# Patient Record
Sex: Female | Born: 1985 | Race: Black or African American | Hispanic: No | Marital: Single | Smoking: Never smoker
Health system: Southern US, Community
[De-identification: ages and names within clinical notes are randomized; demographics above are authoritative.]

## PROBLEM LIST (undated history)

## (undated) DIAGNOSIS — F411 Generalized anxiety disorder: Secondary | ICD-10-CM

## (undated) DIAGNOSIS — N200 Calculus of kidney: Secondary | ICD-10-CM

## (undated) DIAGNOSIS — O210 Mild hyperemesis gravidarum: Secondary | ICD-10-CM

## (undated) HISTORY — PX: LITHOTRIPSY: SUR834

---

## 2013-01-12 ENCOUNTER — Ambulatory Visit: Payer: Self-pay

## 2013-02-06 ENCOUNTER — Ambulatory Visit (INDEPENDENT_AMBULATORY_CARE_PROVIDER_SITE_OTHER): Payer: BC Managed Care – PPO | Admitting: Internal Medicine

## 2013-02-06 ENCOUNTER — Ambulatory Visit: Payer: BC Managed Care – PPO

## 2013-02-06 VITALS — BP 106/75 | HR 70 | Temp 98.7°F | Resp 18 | Ht 62.0 in | Wt 171.0 lb

## 2013-02-06 DIAGNOSIS — Z6831 Body mass index (BMI) 31.0-31.9, adult: Secondary | ICD-10-CM

## 2013-02-06 DIAGNOSIS — M25562 Pain in left knee: Secondary | ICD-10-CM

## 2013-02-06 DIAGNOSIS — M25569 Pain in unspecified knee: Secondary | ICD-10-CM

## 2013-02-06 MED ORDER — MELOXICAM 15 MG PO TABS: 15.0000 mg | ORAL_TABLET | Freq: Every day | ORAL | Status: DC

## 2013-02-06 NOTE — Patient Instructions (Signed)

## 2013-02-06 NOTE — Progress Notes (Signed)
  Subjective:    Patient ID: Dawn Dixon, female    DOB: 09/25/1986, 27 y.o.   MRN: [id number]  HPI  Tuesday patient slipped on some ice in the parking lot of her residence. Her knee has been bothering her since. It hurts more on the outer side of the knee cap.   Review of Systems No prior arthritis or knee injury    Objective:   Physical Exam BP 106/75  Pulse 70  Temp(Src) 98.7 F (37.1 C) (Oral)  Resp 18  Ht 5\' 2"  (1.575 m)  Wt 171 lb (77.565 kg)  BMI 31.27 kg/m2  Left knee with ecchymoses on the medial aspect There is no effusion Patellar ballotts freely with mild tenderness Ligament stable to stressors Has full extension Flexion past 90 painful Antalgic gait     [facility] reading (PRIMARY) by  Dr.Newel Oien=NAD   Assessment & Plan:  Conclusion knee  Mobic 15 Heat and ice Adv as tol Pat sleeve if comf Followup 2 weeks if not well

## 2013-02-07 DIAGNOSIS — Z6831 Body mass index (BMI) 31.0-31.9, adult: Secondary | ICD-10-CM | POA: Insufficient documentation

## 2013-11-12 ENCOUNTER — Emergency Department: Payer: Self-pay | Admitting: Emergency Medicine

## 2019-01-25 ENCOUNTER — Ambulatory Visit
Admission: EM | Admit: 2019-01-25 | Discharge: 2019-01-25 | Disposition: A | Discharge status: Home / Self Care | Payer: Self-pay | Attending: Family Medicine | Admitting: Family Medicine

## 2019-01-25 ENCOUNTER — Other Ambulatory Visit: Payer: Self-pay

## 2019-01-25 DIAGNOSIS — O210 Mild hyperemesis gravidarum: Secondary | ICD-10-CM

## 2019-01-25 HISTORY — DX: Calculus of kidney: N20.0

## 2019-01-25 HISTORY — DX: Mild hyperemesis gravidarum: O21.0

## 2019-01-25 MED ORDER — ONDANSETRON 4 MG PO TBDP: 4.0000 mg | ORAL_TABLET | Freq: Three times a day (TID) | ORAL | 0 refills | Status: DC | PRN

## 2019-01-25 NOTE — Discharge Instructions (Signed)
Take medication as prescribed. Rest. Drink plenty of fluids.  Begin prenatal vitamins.  You need to be seen as soon as possible by OB/GYN.  Call tomorrow to schedule these appointments.  Follow up with your primary care physician this week as needed. Return to Urgent as needed.  Proceed directly to the emergency room for difficulty tolerating food or fluids, any vaginal or abdominal complaints, or worsening concerns.

## 2019-01-25 NOTE — ED Provider Notes (Signed)
[facility] ____________________________________________  Time seen: Approximately 2:00 PM  I have reviewed the triage vital signs and the nursing notes.   HISTORY  Chief Complaint Emesis During Pregnancy   HPI Dawn Dixon is a 33 y.o. female presenting for evaluation of nausea and vomiting during pregnancy.  Patient reports she has a history of hyperemesis gravida been diagnosed with the same during this pregnancy.  Reports that she is approximately [redacted] weeks pregnant.  Patient had confirming ultrasound at [facility] ER on 01/16/2019 showing a single intrauterine pregnancy.  Patient reports that she has a history of hyperemesis gravidarum and previous pregnancy that eventually led to a miscarriage.  Gravida 2 para 0.  States Zofran has been the only thing in the past that has worked for her hyperemesis.  Patient states that the ER did give her some, but she has ran out and she is requesting refill.  States vomiting is approximately 2-3 times a day, with intermittent nausea.  Denies any associated abdominal pain, vaginal discharge, vaginal bleeding, dysuria, back pain, fevers, chest pain, shortness of breath or other complaints.  States at this time feels well.  Patient states that she has been continued to tolerate foods and fluids but with continued nausea.  States that she is fearful to not have Zofran if she needs it.  Does not yet have an OB/GYN appointment scheduled.  Does not have an OB/GYN.   Past Medical History:  Diagnosis Date  . Hyperemesis gravidarum   . Kidney stones     Patient Active Problem List   Diagnosis Date Noted  . BMI 31.0-31.9,adult 02/07/2013    Past Surgical History:  Procedure Laterality Date  . LITHOTRIPSY       No current facility-administered medications for this encounter.   Current Outpatient Medications:  .  ondansetron (ZOFRAN ODT) 4 MG disintegrating tablet, Take 1 tablet (4 mg total) by mouth every 8 (eight) hours as needed for nausea  or vomiting., Disp: 10 tablet, Rfl: 0  Allergies Patient has no known allergies.  History reviewed. No pertinent family history.  Social History Social History   Tobacco Use  . Smoking status: Never Smoker  . Smokeless tobacco: Never Used  Substance Use Topics  . Alcohol use: Not Currently  . Drug use: Not Currently    Review of Systems Constitutional: No fever Cardiovascular: Denies chest pain. Respiratory: Denies shortness of breath. Gastrointestinal: No abdominal pain.  Positive nausea, intermittent vomiting.  No diarrhea.  No constipation.  Last bowel movement yesterday, normal. Genitourinary: Negative for dysuria. Musculoskeletal: Negative for back pain. Skin: Negative for rash.  ____________________________________________   PHYSICAL EXAM:  VITAL SIGNS: ED Triage Vitals  Enc Vitals Group     BP 01/25/19 1051 125/85     Pulse Rate 01/25/19 1051 61     Resp 01/25/19 1051 18     Temp 01/25/19 1051 98.9 F (37.2 C)     Temp Source 01/25/19 1051 Oral     SpO2 01/25/19 1051 100 %     Weight 01/25/19 1052 161 lb (73 kg)     Height 01/25/19 1052 5\' 1"  (1.549 m)     Head Circumference --      Peak Flow --      Pain Score 01/25/19 1051 0     Pain Loc --      Pain Edu? --      Excl. in GC? --     Constitutional: Alert and oriented. Well appearing and in no acute  distress. ENT      Head: Normocephalic and atraumatic. Cardiovascular: Normal rate, regular rhythm. Grossly normal heart sounds.  Good peripheral circulation. Respiratory: Normal respiratory effort without tachypnea nor retractions. Breath sounds are clear and equal bilaterally. No wheezes, rales, rhonchi. Gastrointestinal: Soft and nontender. Normal Bowel sounds. No CVA tenderness. Musculoskeletal: No midline cervical, thoracic or lumbar tenderness to palpation.  Neurologic:  Normal speech and language. Speech is normal. No gait instability.  Skin:  Skin is warm, dry and intact. No rash  noted. Psychiatric: Mood and affect are normal. Speech and behavior are normal. Patient exhibits appropriate insight and judgment   ___________________________________________   LABS (all labs ordered are listed, but only abnormal results are displayed)  Labs Reviewed - No data to display  PROCEDURES Procedures    INITIAL IMPRESSION / ASSESSMENT AND PLAN / ED COURSE  Pertinent labs & imaging results that were available during my care of the patient were reviewed by me and considered in my medical decision making (see chart for details).  Very well-appearing patient.  No acute distress.  Laughing and interactive during exam.  Confirm pregnancy via ultrasound from ED visit.  History of hyperemesis gravidarum.  States Zofran is only thing in the past that works well for her nausea and vomiting.  Discussed other medications including B vitamin containing, patient reports no success in the past but open to trying in the future.  Patient verbalized risk of possible pregnancy related reactions with Zofran and request prescription, Rx quantity 10 given.  Multiple names of OB/GYN's given and recommend call for same tomorrow to get first available appointment.  Discussed for any pain, vaginal discharge, vaginal bleeding, inability to tolerate food or fluids or worsening complaints proceed directly to emergency room.  Patient agreed to plan.  Also encouraged starting prenatal vitamins.  Discussed follow up with Primary care physician this week. Discussed follow up and return parameters including no resolution or any worsening concerns. Patient verbalized understanding and agreed to plan.   ____________________________________________   FINAL CLINICAL IMPRESSION(S) / ED DIAGNOSES  Final diagnoses:  Hyperemesis gravidarum     ED Discharge Orders         Ordered    ondansetron (ZOFRAN ODT) 4 MG disintegrating tablet  Every 8 hours PRN     01/25/19 1325           Note: This dictation was  prepared with Dragon dictation along with smaller phrase technology. Any transcriptional errors that result from this process are unintentional.         Renford DillsMiller, Reann Dobias, NP 01/25/19 1413

## 2019-01-25 NOTE — ED Triage Notes (Addendum)
Pt reports she ran out of her Ondansetron for her early pregnancy nausea/vomiting. Needs refill. States "I am 2-[redacted] weeks pregnant". Unsure of exact LMP

## 2019-12-18 NOTE — L&D Delivery Note (Signed)
Date of delivery: 07/18/2020 Estimated Date of Delivery: None noted. No LMP recorded (lmp unknown). EGA: Unknown  Delivery Note At 4:55 PM a viable female was delivered via Vaginal, Spontaneous (Presentation: OA, Right Occiput Anterior).   APGAR: 8, 9; weight: 3060 g, 6 pounds 12 ounces    Placenta status: Spontaneous, Intact.  Cord: 3 vessels with the following complications: marginal insertion.  Cord pH: NA  Called to see patient.  Mom pushed to deliver a viable female infant.  The head followed by shoulders, which delivered without difficulty, and the rest of the body.  No nuchal cord noted.  Baby to mom's chest.  Cord clamped and cut after 3 min delay.  No cord blood obtained.  Placenta delivered spontaneously, intact, with a 3-vessel cord. Initial brisk bleeding. Cytotec 800 mcg placed rectally All counts correct.  Hemostasis obtained with IV pitocin, cytotec and fundal massage.   Anesthesia: Epidural Episiotomy: None Lacerations: None Suture Repair: NA Est. Blood Loss (mL): 1,385  Mom to postpartum.  Baby to Couplet care / Skin to Skin.  Tresea Mall, CNM 07/18/2020, 6:08 PM

## 2020-07-17 ENCOUNTER — Inpatient Hospital Stay
Admission: EM | Admit: 2020-07-17 | Discharge: 2020-07-20 | DRG: 806 | Disposition: A | Discharge status: Home / Self Care | Payer: Medicaid Other | Attending: Advanced Practice Midwife | Admitting: Advanced Practice Midwife

## 2020-07-17 ENCOUNTER — Other Ambulatory Visit: Payer: Self-pay

## 2020-07-17 ENCOUNTER — Encounter: Payer: Self-pay | Admitting: Emergency Medicine

## 2020-07-17 DIAGNOSIS — F1190 Opioid use, unspecified, uncomplicated: Secondary | ICD-10-CM | POA: Diagnosis present

## 2020-07-17 DIAGNOSIS — O99324 Drug use complicating childbirth: Secondary | ICD-10-CM | POA: Diagnosis present

## 2020-07-17 DIAGNOSIS — Z20822 Contact with and (suspected) exposure to covid-19: Secondary | ICD-10-CM | POA: Diagnosis present

## 2020-07-17 DIAGNOSIS — Z3A00 Weeks of gestation of pregnancy not specified: Secondary | ICD-10-CM | POA: Diagnosis not present

## 2020-07-17 DIAGNOSIS — O0933 Supervision of pregnancy with insufficient antenatal care, third trimester: Secondary | ICD-10-CM

## 2020-07-17 DIAGNOSIS — O1204 Gestational edema, complicating childbirth: Secondary | ICD-10-CM | POA: Diagnosis present

## 2020-07-17 DIAGNOSIS — O43123 Velamentous insertion of umbilical cord, third trimester: Secondary | ICD-10-CM | POA: Diagnosis present

## 2020-07-17 DIAGNOSIS — D62 Acute posthemorrhagic anemia: Secondary | ICD-10-CM | POA: Diagnosis not present

## 2020-07-17 DIAGNOSIS — O9081 Anemia of the puerperium: Secondary | ICD-10-CM | POA: Diagnosis not present

## 2020-07-17 DIAGNOSIS — O99824 Streptococcus B carrier state complicating childbirth: Secondary | ICD-10-CM | POA: Diagnosis present

## 2020-07-17 DIAGNOSIS — F419 Anxiety disorder, unspecified: Secondary | ICD-10-CM | POA: Diagnosis present

## 2020-07-17 DIAGNOSIS — R609 Edema, unspecified: Secondary | ICD-10-CM

## 2020-07-17 DIAGNOSIS — D649 Anemia, unspecified: Secondary | ICD-10-CM | POA: Diagnosis present

## 2020-07-17 DIAGNOSIS — O99344 Other mental disorders complicating childbirth: Secondary | ICD-10-CM | POA: Diagnosis present

## 2020-07-17 DIAGNOSIS — R1030 Lower abdominal pain, unspecified: Secondary | ICD-10-CM | POA: Diagnosis present

## 2020-07-17 HISTORY — DX: Generalized anxiety disorder: F41.1

## 2020-07-17 LAB — CBC
HCT: 24.8 % — ABNORMAL LOW (ref 36.0–46.0)
Hemoglobin: 7.8 g/dL — ABNORMAL LOW (ref 12.0–15.0)
MCH: 24.1 pg — ABNORMAL LOW (ref 26.0–34.0)
MCHC: 31.5 g/dL (ref 30.0–36.0)
MCV: 76.5 fL — ABNORMAL LOW (ref 80.0–100.0)
Platelets: 244 10*3/uL (ref 150–400)
RBC: 3.24 MIL/uL — ABNORMAL LOW (ref 3.87–5.11)
RDW: 14.4 % (ref 11.5–15.5)
WBC: 11.3 10*3/uL — ABNORMAL HIGH (ref 4.0–10.5)
nRBC: 0 % (ref 0.0–0.2)

## 2020-07-17 LAB — URINE DRUG SCREEN, QUALITATIVE (ARMC ONLY)
Amphetamines, Ur Screen: NOT DETECTED
Barbiturates, Ur Screen: NOT DETECTED
Benzodiazepine, Ur Scrn: POSITIVE — AB
Cannabinoid 50 Ng, Ur ~~LOC~~: NOT DETECTED
Cocaine Metabolite,Ur ~~LOC~~: NOT DETECTED
MDMA (Ecstasy)Ur Screen: NOT DETECTED
Methadone Scn, Ur: NOT DETECTED
Opiate, Ur Screen: POSITIVE — AB
Phencyclidine (PCP) Ur S: NOT DETECTED
Tricyclic, Ur Screen: NOT DETECTED

## 2020-07-17 MED ORDER — PENICILLIN G POT IN DEXTROSE 60000 UNIT/ML IV SOLN
3.0000 10*6.[IU] | INTRAVENOUS | Status: DC
  Administered 2020-07-18 (×4): 3 10*6.[IU] via INTRAVENOUS
  Filled 2020-07-17 (×4): qty 50

## 2020-07-17 MED ORDER — LIDOCAINE HCL (PF) 1 % IJ SOLN: 30.0000 mL | INTRAMUSCULAR | Status: DC | PRN

## 2020-07-17 MED ORDER — OXYTOCIN BOLUS FROM INFUSION: 333.0000 mL | Freq: Once | INTRAVENOUS | Status: AC

## 2020-07-17 MED ORDER — BUTORPHANOL TARTRATE 1 MG/ML IJ SOLN: 1.0000 mg | INTRAMUSCULAR | Status: DC | PRN

## 2020-07-17 MED ORDER — OXYTOCIN 10 UNIT/ML IJ SOLN
INTRAMUSCULAR | Status: AC
  Filled 2020-07-17: qty 2

## 2020-07-17 MED ORDER — ONDANSETRON HCL 4 MG/2ML IJ SOLN: 4.0000 mg | Freq: Four times a day (QID) | INTRAMUSCULAR | Status: DC | PRN

## 2020-07-17 MED ORDER — MISOPROSTOL 200 MCG PO TABS
ORAL_TABLET | ORAL | Status: AC
  Administered 2020-07-18: 800 ug via RECTAL
  Filled 2020-07-17: qty 4

## 2020-07-17 MED ORDER — OXYTOCIN-SODIUM CHLORIDE 30-0.9 UT/500ML-% IV SOLN
INTRAVENOUS | Status: AC
  Administered 2020-07-18: 333 mL via INTRAVENOUS
  Filled 2020-07-17: qty 500

## 2020-07-17 MED ORDER — LACTATED RINGERS IV SOLN: 500.0000 mL | INTRAVENOUS | Status: DC | PRN

## 2020-07-17 MED ORDER — SODIUM CHLORIDE 0.9 % IV SOLN
5.0000 10*6.[IU] | Freq: Once | INTRAVENOUS | Status: AC
  Administered 2020-07-18: 5 10*6.[IU] via INTRAVENOUS
  Filled 2020-07-17: qty 5

## 2020-07-17 MED ORDER — OXYTOCIN-SODIUM CHLORIDE 30-0.9 UT/500ML-% IV SOLN
2.5000 [IU]/h | INTRAVENOUS | Status: DC
  Administered 2020-07-18 (×2): 2.5 [IU]/h via INTRAVENOUS
  Filled 2020-07-17: qty 500

## 2020-07-17 MED ORDER — LACTATED RINGERS IV SOLN: INTRAVENOUS | Status: DC

## 2020-07-17 MED ORDER — LIDOCAINE HCL (PF) 1 % IJ SOLN
INTRAMUSCULAR | Status: AC
  Filled 2020-07-17: qty 30

## 2020-07-17 MED ORDER — ACETAMINOPHEN 325 MG PO TABS: 650.0000 mg | ORAL_TABLET | ORAL | Status: DC | PRN

## 2020-07-17 MED ORDER — AMMONIA AROMATIC IN INHA
RESPIRATORY_TRACT | Status: AC
  Filled 2020-07-17: qty 10

## 2020-07-17 NOTE — H&P (Signed)
Obstetrics Admission History & Physical   Abdominal Pain (Pregnant No Prenatal Care) and Vaginal Bleeding   HPI:  34 y.o. [id number] AA F @ Unknown gest age, no known LMP. Admitted on 07/17/2020:   Patient Active Problem List   Diagnosis Date Noted  . No prenatal care in current pregnancy in third trimester 07/17/2020  . BMI 31.0-31.9,adult 02/07/2013     Presents for lower abdominal pain for the last 2 days, and only noted some spotting today.  She denies knowing she was pregnant, blames anxiety and blaming kidney potential disease (FH).  She has had edema the last 2 weeks.  Reports nausea and hyperemesis in prior two pregnancies, and had EAb with those.  No recent trauma or infection.  Prenatal care at: No prenatal care. Pregnancy complicated by that fact.  ROS: A review of systems was performed and negative, except as stated in the above HPI. PMHx:  Past Medical History:  Diagnosis Date  . Generalized anxiety disorder   . Hyperemesis gravidarum   . Kidney stones    PSHx:  Past Surgical History:  Procedure Laterality Date  . LITHOTRIPSY     Medications:  Medications Prior to Admission  Medication Sig Dispense Refill Last Dose  . ondansetron (ZOFRAN ODT) 4 MG disintegrating tablet Take 1 tablet (4 mg total) by mouth every 8 (eight) hours as needed for nausea or vomiting. 10 tablet 0 Unknown at Unknown time   Allergies: has No Known Allergies. OBHx:  OB History  Gravida Para Term Preterm AB Living  1            SAB TAB Ectopic Multiple Live Births               # Outcome Date GA Lbr Len/2nd Weight Sex Delivery Anes PTL Lv  1 Current            NAT:[id number]/[id number] except as detailed in HPI.Marland Kitchen  No family history of birth defects. Soc Hx: Alcohol: none and Recreational drug use: none  Objective:   Vitals:   07/17/20 2106 07/17/20 2227  BP: (!) 134/68 (!) 140/80  Pulse: 69 76  Resp: 18 18  Temp: 98.3 F (36.8 C)   SpO2: 99%    Constitutional: Well nourished,  well developed female in no acute distress.  HEENT: normal Skin: Warm and dry.  Cardiovascular:Regular rate and rhythm.   Extremity: trace to 1+ bilateral pedal edema Respiratory: Clear to auscultation bilateral. Normal respiratory effort Abdomen: gravid, ND, FHT present, mild tenderness on exam Back: no CVAT Neuro: DTRs 2+, Cranial nerves grossly intact Psych: Alert and Oriented x3. No memory deficits. Normal mood and affect.  MS: normal gait, normal bilateral lower extremity ROM/strength/stability.  Pelvic exam: is not limited by body habitus EGBUS: within normal limits Vagina: within normal limits and with normal mucosa Cervix: EXTERNAL GENITALIA: normal appearing vulva with no masses, tenderness or lesions CERVIX: 4 cm dilated, 80 effaced, -2 station, presenting part Vtx VAGINA: no abnormalities noted MEMBRANES: intact Uterus: Spontaneous uterine activity  Adnexa: not evaluated  EFM:FHR: 120 bpm, variability: moderate,  accelerations:  Present,  decelerations:  Absent Toco: Frequency: Every 10 minutes  [place] by self.    Vtx    BPD and HC limited as head in pelvis.  HC 36 3/7.  BPD 36 6/7.    AC 38 5/7    FL 35 0/7    Low Amniotic fluid, 3 cm deepest pocket  Assessment & Plan:   34 y.o. [id number] AA F @  Unknown gest age, Admitted on 07/17/2020:No prenatal care, seems close to full term by [place]   Active labor based on ctxs and 4 cm dilated  Admit for labor, Antibiotics for GBS prophylaxis, Observe for cervical change, Fetal Wellbeing Reassuring and AROM when Appropriate   Monitor fetal wellbeing.  Possibility for CS discussed if fetal status non-reassuring.  Risks due to no PNC and uncertain fetal and placental status.  Risks of prematurity discussed, as we only have a rough estimate of gestational age by ultrasound alone.  Patient has been counseled as to the risks of prematurity, including risks of respiratory depression or distress, jaundice, feeding or temperature regulation  problems, neurologic concerns including hearing or visual problems, and brain complications.  Patient understands the risks of tocolytic therapies along with their inherent failure rates, and the reasons for and against their use in individual circumstances.    Annamarie Major, MD, [facility], [facility] 07/17/2020  10:50 PM

## 2020-07-17 NOTE — ED Notes (Signed)
Per Dr. Derrill Kay, pt is greater than 20 weeks via bedside US, L & D called, taking pt to observation room 2.

## 2020-07-17 NOTE — OB Triage Note (Signed)
Pt arrived to ED for abdominal cramping that started about 2 days ago. She states she missed her depo shot one month, unsure of which month, and has been in denial about pregnancy. Patient reports she has not been seen anywhere for this pregnancy. Reports feeling good fetal movement. Denies leaking of fluid. Reports scant amount vaginal bleeding one time today. Reports feeling "cramping" approx once an hour. EFM applied and assessing.

## 2020-07-17 NOTE — OB Triage Note (Signed)
Report called from ED to notify of patient pregnant greater than 20 weeks by U/S with c/o lower abdominal and back pain. Patient arrived on unit and has remained in bathroom with water running. RN at bedside since approx 2154. Introduced self to patient through closed door. Pt states she's ok, she is just having some cramping and trying to get a urine sample. Visitor arrived to room and also checked on patient through closed door. Patient stated she is fine and would be out in a minute.

## 2020-07-17 NOTE — ED Notes (Addendum)
Pt has no OB-GYN in this area. Pt advised that resources are available to her if she does not want to keep the child.  Pt verbalizes understanding.

## 2020-07-17 NOTE — ED Triage Notes (Addendum)
PT arrived via POV with reports of low abdominal pain and back pain.  Pt is pregnant and unsure how far along, pt has not had any prenatal care, states she has been in denial about being pregnant.  Pt states she has had back pain x 2 months, pt reports legs and feet are swollen as well.  Pt states she has never been this pregnant before.

## 2020-07-18 ENCOUNTER — Inpatient Hospital Stay: Payer: Medicaid Other | Admitting: Anesthesiology

## 2020-07-18 ENCOUNTER — Encounter: Payer: Self-pay | Admitting: Obstetrics & Gynecology

## 2020-07-18 DIAGNOSIS — O0933 Supervision of pregnancy with insufficient antenatal care, third trimester: Secondary | ICD-10-CM

## 2020-07-18 LAB — RAPID HIV SCREEN (HIV 1/2 AB+AG)
HIV 1/2 Antibodies: NONREACTIVE
HIV-1 P24 Antigen - HIV24: NONREACTIVE

## 2020-07-18 LAB — CBC WITH DIFFERENTIAL/PLATELET
Abs Immature Granulocytes: 0.11 K/uL — ABNORMAL HIGH (ref 0.00–0.07)
Basophils Absolute: 0 K/uL (ref 0.0–0.1)
Basophils Relative: 0 %
Eosinophils Absolute: 0 K/uL (ref 0.0–0.5)
Eosinophils Relative: 0 %
HCT: 20.1 % — ABNORMAL LOW (ref 36.0–46.0)
Hemoglobin: 6.3 g/dL — ABNORMAL LOW (ref 12.0–15.0)
Immature Granulocytes: 1 %
Lymphocytes Relative: 9 %
Lymphs Abs: 1.7 K/uL (ref 0.7–4.0)
MCH: 24 pg — ABNORMAL LOW (ref 26.0–34.0)
MCHC: 31.3 g/dL (ref 30.0–36.0)
MCV: 76.4 fL — ABNORMAL LOW (ref 80.0–100.0)
Monocytes Absolute: 1.3 K/uL — ABNORMAL HIGH (ref 0.1–1.0)
Monocytes Relative: 7 %
Neutro Abs: 15 K/uL — ABNORMAL HIGH (ref 1.7–7.7)
Neutrophils Relative %: 83 %
Platelets: 254 K/uL (ref 150–400)
RBC: 2.63 MIL/uL — ABNORMAL LOW (ref 3.87–5.11)
RDW: 14.4 % (ref 11.5–15.5)
WBC: 18.1 K/uL — ABNORMAL HIGH (ref 4.0–10.5)
nRBC: 0 % (ref 0.0–0.2)

## 2020-07-18 LAB — COMPREHENSIVE METABOLIC PANEL
ALT: 14 U/L (ref 0–44)
AST: 22 U/L (ref 15–41)
Albumin: 2.8 g/dL — ABNORMAL LOW (ref 3.5–5.0)
Alkaline Phosphatase: 223 U/L — ABNORMAL HIGH (ref 38–126)
Anion gap: 9 (ref 5–15)
BUN: <5 mg/dL — ABNORMAL LOW (ref 6–20)
CO2: 20 mmol/L — ABNORMAL LOW (ref 22–32)
Calcium: 8.2 mg/dL — ABNORMAL LOW (ref 8.9–10.3)
Chloride: 107 mmol/L (ref 98–111)
Creatinine, Ser: 0.44 mg/dL (ref 0.44–1.00)
GFR calc Af Amer: >60 mL/min (ref >60)
GFR calc non Af Amer: >60 mL/min (ref >60)
Glucose, Bld: 87 mg/dL (ref 70–99)
Potassium: 3.7 mmol/L (ref 3.5–5.1)
Sodium: 136 mmol/L (ref 135–145)
Total Bilirubin: 0.9 mg/dL (ref 0.3–1.2)
Total Protein: 6.2 g/dL — ABNORMAL LOW (ref 6.5–8.1)

## 2020-07-18 LAB — PREPARE RBC (CROSSMATCH)

## 2020-07-18 LAB — ABO/RH: ABO/RH(D): B POS

## 2020-07-18 LAB — RPR: RPR Ser Ql: NONREACTIVE

## 2020-07-18 LAB — PROTEIN / CREATININE RATIO, URINE
Creatinine, Urine: 259 mg/dL
Protein Creatinine Ratio: 0.19 mg/mg{Cre} — ABNORMAL HIGH (ref 0.00–0.15)
Total Protein, Urine: 50 mg/dL

## 2020-07-18 LAB — CHLAMYDIA/NGC RT PCR (ARMC ONLY)
Chlamydia Tr: NOT DETECTED
N gonorrhoeae: NOT DETECTED

## 2020-07-18 LAB — GROUP B STREP BY PCR: Group B strep by PCR: POSITIVE — AB

## 2020-07-18 LAB — SARS CORONAVIRUS 2 BY RT PCR (HOSPITAL ORDER, PERFORMED IN ~~LOC~~ HOSPITAL LAB): SARS Coronavirus 2: NEGATIVE

## 2020-07-18 LAB — HEPATITIS B SURFACE ANTIGEN: Hepatitis B Surface Ag: NONREACTIVE

## 2020-07-18 MED ORDER — LACTATED RINGERS IV SOLN
500.0000 mL | Freq: Once | INTRAVENOUS | Status: AC
  Administered 2020-07-18: 500 mL via INTRAVENOUS

## 2020-07-18 MED ORDER — BUPIVACAINE HCL (PF) 0.25 % IJ SOLN
INTRAMUSCULAR | Status: DC | PRN
  Administered 2020-07-18: 2 mL via EPIDURAL

## 2020-07-18 MED ORDER — COCONUT OIL OIL: 1.0000 "application " | TOPICAL_OIL | Status: DC | PRN

## 2020-07-18 MED ORDER — LIDOCAINE-EPINEPHRINE (PF) 1.5 %-1:200000 IJ SOLN
INTRAMUSCULAR | Status: DC | PRN
  Administered 2020-07-18: 3 mL via EPIDURAL

## 2020-07-18 MED ORDER — MISOPROSTOL 200 MCG PO TABS: 800.0000 ug | ORAL_TABLET | Freq: Once | ORAL | Status: AC

## 2020-07-18 MED ORDER — EPHEDRINE 5 MG/ML INJ: 10.0000 mg | INTRAVENOUS | Status: DC | PRN

## 2020-07-18 MED ORDER — TERBUTALINE SULFATE 1 MG/ML IJ SOLN: 0.2500 mg | Freq: Once | INTRAMUSCULAR | Status: DC | PRN

## 2020-07-18 MED ORDER — LIDOCAINE HCL (PF) 1 % IJ SOLN
INTRAMUSCULAR | Status: DC | PRN
  Administered 2020-07-18: 3 mL via SUBCUTANEOUS

## 2020-07-18 MED ORDER — ONDANSETRON HCL 4 MG PO TABS
4.0000 mg | ORAL_TABLET | ORAL | Status: DC | PRN
  Filled 2020-07-18: qty 1

## 2020-07-18 MED ORDER — SODIUM CHLORIDE 0.9 % IV SOLN
INTRAVENOUS | Status: DC | PRN
  Administered 2020-07-18: 5 mL via EPIDURAL

## 2020-07-18 MED ORDER — PHENYLEPHRINE 40 MCG/ML (10ML) SYRINGE FOR IV PUSH (FOR BLOOD PRESSURE SUPPORT): 80.0000 ug | PREFILLED_SYRINGE | INTRAVENOUS | Status: DC | PRN

## 2020-07-18 MED ORDER — ACETAMINOPHEN 325 MG PO TABS
ORAL_TABLET | ORAL | Status: AC
  Filled 2020-07-18: qty 2

## 2020-07-18 MED ORDER — DIPHENHYDRAMINE HCL 50 MG/ML IJ SOLN: 12.5000 mg | INTRAMUSCULAR | Status: DC | PRN

## 2020-07-18 MED ORDER — DIPHENHYDRAMINE HCL 25 MG PO CAPS: 25.0000 mg | ORAL_CAPSULE | Freq: Four times a day (QID) | ORAL | Status: DC | PRN

## 2020-07-18 MED ORDER — AMMONIA AROMATIC IN INHA
RESPIRATORY_TRACT | Status: AC
  Filled 2020-07-18: qty 10

## 2020-07-18 MED ORDER — ONDANSETRON HCL 4 MG/2ML IJ SOLN: 4.0000 mg | INTRAMUSCULAR | Status: DC | PRN

## 2020-07-18 MED ORDER — DIBUCAINE (PERIANAL) 1 % EX OINT: 1.0000 "application " | TOPICAL_OINTMENT | CUTANEOUS | Status: DC | PRN

## 2020-07-18 MED ORDER — SENNOSIDES-DOCUSATE SODIUM 8.6-50 MG PO TABS
2.0000 | ORAL_TABLET | ORAL | Status: DC
  Administered 2020-07-20: 2 via ORAL
  Filled 2020-07-18 (×2): qty 2

## 2020-07-18 MED ORDER — WITCH HAZEL-GLYCERIN EX PADS: 1.0000 "application " | MEDICATED_PAD | CUTANEOUS | Status: DC | PRN

## 2020-07-18 MED ORDER — TETANUS-DIPHTH-ACELL PERTUSSIS 5-2.5-18.5 LF-MCG/0.5 IM SUSP
0.5000 mL | Freq: Once | INTRAMUSCULAR | Status: AC
  Administered 2020-07-20: 0.5 mL via INTRAMUSCULAR
  Filled 2020-07-18 (×2): qty 0.5

## 2020-07-18 MED ORDER — IBUPROFEN 600 MG PO TABS
ORAL_TABLET | ORAL | Status: AC
  Filled 2020-07-18: qty 1

## 2020-07-18 MED ORDER — MISOPROSTOL 200 MCG PO TABS: 200.0000 ug | ORAL_TABLET | Freq: Once | ORAL | Status: DC

## 2020-07-18 MED ORDER — FENTANYL 2.5 MCG/ML W/ROPIVACAINE 0.15% IN NS 100 ML EPIDURAL (ARMC)
12.0000 mL/h | EPIDURAL | Status: DC
  Administered 2020-07-18 (×2): 12 mL/h via EPIDURAL
  Filled 2020-07-18: qty 100

## 2020-07-18 MED ORDER — SIMETHICONE 80 MG PO CHEW: 80.0000 mg | CHEWABLE_TABLET | ORAL | Status: DC | PRN

## 2020-07-18 MED ORDER — BENZOCAINE-MENTHOL 20-0.5 % EX AERO: 1.0000 "application " | INHALATION_SPRAY | CUTANEOUS | Status: DC | PRN

## 2020-07-18 MED ORDER — OXYCODONE HCL 5 MG PO TABS
5.0000 mg | ORAL_TABLET | Freq: Four times a day (QID) | ORAL | Status: AC | PRN
  Administered 2020-07-18 – 2020-07-19 (×3): 5 mg via ORAL
  Filled 2020-07-18 (×3): qty 1

## 2020-07-18 MED ORDER — OXYTOCIN-SODIUM CHLORIDE 30-0.9 UT/500ML-% IV SOLN
1.0000 m[IU]/min | INTRAVENOUS | Status: DC
  Administered 2020-07-18: 2 m[IU]/min via INTRAVENOUS

## 2020-07-18 MED ORDER — PRENATAL MULTIVITAMIN CH
1.0000 | ORAL_TABLET | Freq: Every day | ORAL | Status: DC
  Administered 2020-07-19 – 2020-07-20 (×2): 1 via ORAL
  Filled 2020-07-18 (×2): qty 1

## 2020-07-18 MED ORDER — ALPRAZOLAM ER 0.5 MG PO TB24
0.5000 mg | ORAL_TABLET | Freq: Every day | ORAL | Status: DC
  Filled 2020-07-18: qty 1

## 2020-07-18 MED ORDER — SODIUM CHLORIDE 0.9% IV SOLUTION: Freq: Once | INTRAVENOUS | Status: DC

## 2020-07-18 MED ORDER — ACETAMINOPHEN 325 MG PO TABS
650.0000 mg | ORAL_TABLET | ORAL | Status: DC | PRN
  Administered 2020-07-18 – 2020-07-19 (×4): 650 mg via ORAL
  Filled 2020-07-18 (×3): qty 2

## 2020-07-18 MED ORDER — IBUPROFEN 600 MG PO TABS
600.0000 mg | ORAL_TABLET | Freq: Four times a day (QID) | ORAL | Status: DC
  Administered 2020-07-18 – 2020-07-20 (×8): 600 mg via ORAL
  Filled 2020-07-18 (×7): qty 1

## 2020-07-18 MED ORDER — FENTANYL 2.5 MCG/ML W/ROPIVACAINE 0.15% IN NS 100 ML EPIDURAL (ARMC)
EPIDURAL | Status: AC
  Filled 2020-07-18: qty 100

## 2020-07-18 NOTE — Anesthesia Preprocedure Evaluation (Signed)
Anesthesia Evaluation  Patient identified by MRN, date of birth, ID band Patient awake    Reviewed: Allergy & Precautions, H&P , NPO status , Patient's Chart, lab work & pertinent test results, reviewed documented beta blocker date and time   History of Anesthesia Complications Negative for: history of anesthetic complications  Airway Mallampati: I  TM Distance: >3 FB Neck ROM: full    Dental no notable dental hx.    Pulmonary neg pulmonary ROS,    Pulmonary exam normal breath sounds clear to auscultation       Cardiovascular Exercise Tolerance: Good (-) hypertension(-) angina(-) Past MI and (-) Cardiac Stents Normal cardiovascular exam(-) dysrhythmias + Valvular Problems/Murmurs (as a child)  Rhythm:regular Rate:Normal     Neuro/Psych PSYCHIATRIC DISORDERS Anxiety negative neurological ROS     GI/Hepatic Neg liver ROS, GERD  ,  Endo/Other  negative endocrine ROS  Renal/GU Renal disease (kidney stones)  negative genitourinary   Musculoskeletal   Abdominal   Peds  Hematology negative hematology ROS (+)   Anesthesia Other Findings Past Medical History: No date: Generalized anxiety disorder No date: Hyperemesis gravidarum No date: Kidney stones  Likely history of drug use, but does not admit to anything aside from Xanax.  Patient does appear to be intoxicated on some substance at time of epidural placement, but can answer all questions appropriately.  Reproductive/Obstetrics (+) Pregnancy                             Anesthesia Physical Anesthesia Plan  ASA: II  Anesthesia Plan: Epidural   Post-op Pain Management:    Induction:   PONV Risk Score and Plan:   Airway Management Planned:   Additional Equipment:   Intra-op Plan:   Post-operative Plan:   Informed Consent: I have reviewed the patients History and Physical, chart, labs and discussed the procedure including the  risks, benefits and alternatives for the proposed anesthesia with the patient or authorized representative who has indicated his/her understanding and acceptance.     Dental Advisory Given  Plan Discussed with: Anesthesiologist, CRNA and Surgeon  Anesthesia Plan Comments:         Anesthesia Quick Evaluation

## 2020-07-18 NOTE — Progress Notes (Addendum)
RN at pt bedside for physical assessment and admission questions. While this RN asks questions, pt appears to be sluggish and incoherent. RN asked pt if she has taken any substances since being in the hospital and pt denied. Pt has spent an extensive amount of time in the bathroom when she goes in there. AC notified of situation and verbal instruction given from [facility] policy to look through pt belongings with pt to verify no medications are at bedside. Upon inspection, CBD vape was found and turned into locker for the duration of the stay. Pt verbalized understanding and was compliant.   When this RN asked the pt about the care of the baby, pt stated she "did not want custody of the baby" and that she "did not want kids." Pt stated her "cousin, Jerrel Ivory" at bedside is considering adopting the baby but needed time to think about it. Earlier, pt stated she has known Jerrel Ivory since she was 74. This RN explained that a Child psychotherapist will discuss options and legalities to the situation this admission. Pt verbalized understanding and was in agreement.

## 2020-07-18 NOTE — TOC Initial Note (Addendum)
Transition of Care [facility]) - Initial/Assessment Note    Patient Details  Name: Dawn Dixon MRN: [id number] Date of Birth: [birth year]  [facility] CM/SW Contact:    Payne Cellar, RN Phone Number: 07/18/2020, 1:51 PM  Clinical Narrative:                 TOC aware of consult. LVMM with [place] DSS to begin filing CPS report for potential placement/adoption.   Spoke with Hailey @ DSS Intake-will need to call back once infant born if concerns for drug abuse or if "cousin" is not felt to be appropriate for custody.         Patient Goals and CMS Choice        Expected Discharge Plan and Services                                                Prior Living Arrangements/Services                       Activities of Daily Living Home Assistive Devices/Equipment: None ADL Screening (condition at time of admission) Patient's cognitive ability adequate to safely complete daily activities?: Yes Is the patient deaf or have difficulty hearing?: No Does the patient have difficulty seeing, even when wearing glasses/contacts?: No Does the patient have difficulty concentrating, remembering, or making decisions?: No Patient able to express need for assistance with ADLs?: Yes Does the patient have difficulty dressing or bathing?: No Independently performs ADLs?: Yes (appropriate for developmental age) Does the patient have difficulty walking or climbing stairs?: No Weakness of Legs: None Weakness of Arms/Hands: None  Permission Sought/Granted                  Emotional Assessment              Admission diagnosis:  No prenatal care in current pregnancy in third trimester [O09.33] Patient Active Problem List   Diagnosis Date Noted  . No prenatal care in current pregnancy in third trimester 07/17/2020  . BMI 31.0-31.9,adult 02/07/2013   PCP:  Jacquenette Shone, NP Pharmacy:   [facility] [phone] [place], [place] - [address] [place] [address] Phone: [phone] Fax: [phone]     Social Determinants of Health (SDOH) Interventions    Readmission Risk Interventions No flowsheet data found.

## 2020-07-18 NOTE — Discharge Summary (Signed)
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 yo [id number] at Unknown was admitted in Active Labor on 07/17/2020.  Patient had an uncomplicated labor course as follows:  Membrane Rupture Time/Date: 4:19 AM ,07/18/2020   Delivery Method:Vaginal, Spontaneous  Episiotomy: None  Lacerations:  None  Patient had a postpartum course complicated by severe anemia  (hmg 6.3 g/dl)  after postpartum hemorrhage and received 3 units of blood. Her hemoglobin rose to 8.6gm/dl after the transfusion. She was also seen by the social worker because her initial plan of placing baby for adoption as well as substance use during pregnancy (UDS was positive for benzodiazepines and opiates) and no prenatal care. Dawn Dixon then decided not to place the baby for adoption. She presented with extensive non pitting edema and had normal kidney and liver function, a normal TSH and Dopplers on her lower extremities ruled out DVT. She occasionally has a mild range blood pressure.  On PPD #2 she was voiding without difficulty, had a BM, was ambulating without lightheadedness, and was tolerating a regular diet. She has a history of anxiety and has been taking Xanax. After a  discussion, mutual agreement to start her on Lexapro for the anxiety with hopes of weaning her off the Xanax. Also encouraged her to follow up with [facility].  Patient is discharged home in stable condition on 07/20/20. To follow up with me early next week in the office.  Newborn Data: Birth date: 07/18/2020  Birth time: 4:55 PM  Gender: Female  Living status: Living Dawn Dixon Apgars: 8 ,9  Weight: 3060 g, 6 pounds 12 ounces  Physical exam  Vitals:   07/19/20 2358 07/20/20 0315 07/20/20 0809 07/20/20 1209  BP: 129/77 135/79 127/74 138/86  Pulse: (!) 54 (!) 58 65 75  Resp: 18 18 20 18   Temp: 97.7 F (36.5 C) 98 F (36.7 C) 97.6 F (36.4 C) 97.8 F (36.6 C)  TempSrc: Oral  Oral Oral  SpO2: 100% 97% 99%   Weight:      Height:       General: alert, cooperative and no distress Lochia: appropriate Uterine Fundus: firm Incision: N/A DVT Evaluation: No evidence of DVT seen on physical exam. +2 to +3 nonpitting edema  Labs: Lab Results  Component Value Date   WBC 10.8 (H) 07/19/2020   HGB 8.6 (L) 07/19/2020  HCT 25.7 (L) 07/19/2020   MCV 80.1 07/19/2020   PLT 213 07/19/2020    Discharge instruction: per After Visit Summary.  Medications:  Allergies as of 07/20/2020   No Known Allergies     Medication List    STOP taking these medications   ondansetron 4 MG disintegrating tablet Commonly known as: Zofran ODT     TAKE these medications   ALPRAZolam 0.5 MG 24 hr tablet Commonly known as: XANAX XR Take 1 tablet (0.5 mg total) by mouth 2 (two) times daily as needed for anxiety. What changed:   when to take this  reasons to take this   escitalopram 5 MG tablet Commonly known as: LEXAPRO Take 1 tablet (5 mg total) by mouth daily. Start taking on: July 21, 2020   ferrous sulfate 325 (65 FE) MG tablet Take 1 tablet (325 mg total) by mouth 2 (two) times daily with a meal.   hydrochlorothiazide 12.5 MG capsule Commonly known as: MICROZIDE Take 1 capsule (12.5 mg total)  by mouth daily. Start taking on: July 21, 2020   ibuprofen 600 MG tablet Commonly known as: ADVIL Take 1 tablet (600 mg total) by mouth every 6 (six) hours as needed for mild pain, moderate pain or cramping.   prenatal multivitamin Tabs tablet Take 1 tablet by mouth daily at 12 noon. Start taking on: July 21, 2020       Diet: routine diet  Activity: Advance as tolerated. Pelvic rest for 6 weeks.   Outpatient follow up:  Follow-up Information    [facility] [place]. Go on 08/02/2020.   Specialty: Obstetrics and Gynecology Why: Mood support postpartum: Tuesday, August 17th at 9:10am with Dawn Dixon, CNM at [facility]!  Contact information: [address] [place] [address] [phone]       Dawn Dixon, CNM. Schedule an appointment as soon as possible for a visit on 07/26/2020.   Specialty: Certified Nurse Midwife Why: Make an appointmet to see Dawn Dixon on 10 or 11 August Contact information: [address] [place] [address] [phone]                 Postpartum contraception: Depo Provera Rhogam Given postpartum: NA Rubella vaccine given postpartum: no Varicella vaccine given postpartum: yes TDaP given antepartum or postpartum: given PP    Newborn Delivery   Birth date/time: 07/18/2020 16:55:00 Delivery type: Vaginal, Spontaneous       Baby Feeding: Bottle  Disposition:remains for Eat, Sleep, Console.  SIGNED:  Farrel Dixon, CNM 07/20/2020 1:35 PM

## 2020-07-18 NOTE — Progress Notes (Signed)
  Labor Progress Note   34 y.o. [id number] @ Unknown , admitted for  Pregnancy, Labor Management. No prenatal care, estimated gestational age by exam and [place] 36-38 weeks  Subjective:  Pain improved after epidural  Objective:  BP (!) 133/78   Pulse 80   Temp 98.1 F (36.7 C) (Oral)   Resp 18   Ht 5\' 1"  (1.549 m)   Wt 70.3 kg   LMP  (LMP Unknown)   SpO2 100%   BMI 29.29 kg/m  Abd: gravid, ND, FHT present, mild tenderness on exam Extr: trace to 1+ bilateral pedal edema SVE: CERVIX: 7 cm dilated, 80 effaced, -2 station AROM clear EFM: FHR: 120 bpm, variability: moderate,  accelerations:  Present,  decelerations:  Absent Toco: Frequency: Every 5 minutes Labs: I have reviewed the patient's lab results. Anemia No signs of preeclampsia B+ GBS POS UDS POS Opiates and Benzo  Assessment & Plan:  @ Unknown, admitted for  Pregnancy and Labor/Delivery Management  1. Pain management: epidural. 2. FWB: FHT category 1.  3. ID: GBS positive 4. Labor management: Will need social services consult post partum, unsure about desire to raise child UDS concerns Active labor, AROM now, Epidural in place  All discussed with patient, see orders  [id number], MD, [facility], [facility] 07/18/2020  4:21 AM

## 2020-07-18 NOTE — Progress Notes (Signed)
  Labor Progress Note   34 y.o. [id number] @ Unknown , admitted for  Pregnancy, Labor Management.   Subjective:  Back pain after sitting up high fowlers and is now supine. She has been using PCA.  Objective:  BP 112/70   Pulse 60   Temp 98.3 F (36.8 C) (Oral)   Resp 14   Ht 5\' 1"  (1.549 m)   Wt 70.3 kg   LMP  (LMP Unknown)   SpO2 98%   BMI 29.29 kg/m  Abd: gravid, ND, FHT present, mild tenderness on exam Extr: 2+ bilateral pedal edema SVE: CERVIX: 9 cm dilated, 90 effaced, 0,+1 station  EFM: FHR: 110 bpm, variability: moderate,  accelerations:  Present,  decelerations:  Absent Toco: Frequency: Every 2-4 minutes Labs: I have reviewed the patient's lab results.   Assessment & Plan:  @ Unknown, admitted for  Pregnancy and Labor/Delivery Management  1. Pain management: epidural. 2. FWB: FHT category I.  3. ID: GBS positive, penicillin treatment 4. Labor management: continue pitocin, anesthesia if needed for pain control  All discussed with patient, see orders   [id number], CNM [facility] 07/18/2020  12:37 PM

## 2020-07-18 NOTE — Progress Notes (Signed)
  Labor Progress Note   34 y.o. [id number] @ Unknown , admitted for  Pregnancy, Labor Management.   Subjective:  Comfortable with epidural  Objective:  BP (!) 118/58 (BP Location: Right Arm)   Pulse 71   Temp 98.3 F (36.8 C) (Oral)   Resp 14   Ht 5\' 1"  (1.549 m)   Wt 70.3 kg   LMP  (LMP Unknown)   SpO2 100%   BMI 29.29 kg/m  Abd: gravid, ND, FHT present, mild tenderness on exam Extr: 2+ bilateral pedal/lower extremity edema SVE: CERVIX: 8.5 cm dilated, 80-90 effaced, 0,+1 station  EFM: FHR: 115 bpm, variability: minimal/moderate,  accelerations:  Present,  decelerations:  Present to 100 bpm times 2 minutes Toco: Frequency: Every 5-6 minutes Labs: I have reviewed the patient's lab results.   Assessment & Plan:  @ Unknown, admitted for  Pregnancy and Labor/Delivery Management  1. Pain management: epidural. 2. FWB: FHT category II.  3. ID: GBS positive treat with penicillin 4. Labor management: start pitocin  All discussed with patient, see orders   [id number], CNM [facility] 07/18/2020  9:30 AM

## 2020-07-18 NOTE — Anesthesia Procedure Notes (Signed)
Epidural Patient location during procedure: OB Start time: 07/18/2020 3:02 AM End time: 07/18/2020 3:10 AM  Staffing Anesthesiologist: Lenard Simmer, MD Performed: anesthesiologist   Preanesthetic Checklist Completed: patient identified, IV checked, site marked, risks and benefits discussed, surgical consent, monitors and equipment checked, pre-op evaluation and timeout performed  Epidural Patient position: sitting Prep: ChloraPrep Patient monitoring: heart rate, continuous pulse ox and blood pressure Approach: midline Location: L3-L4 Injection technique: LOR saline  Needle:  Needle type: Tuohy  Needle gauge: 17 G Needle length: 9 cm and 9 Needle insertion depth: 4.5 cm Catheter type: closed end flexible Catheter size: 19 Gauge Catheter at skin depth: 9.5 cm Test dose: negative and 1.5% lidocaine with Epi 1:200 K  Assessment Sensory level: T10 Events: blood not aspirated, injection not painful, no injection resistance, no paresthesia and negative IV test  Additional Notes 1st attempt Pt. Evaluated and documentation done after procedure finished. Patient identified. Risks/Benefits/Options discussed with patient including but not limited to bleeding, infection, nerve damage, paralysis, failed block, incomplete pain control, headache, blood pressure changes, nausea, vomiting, reactions to medication both or allergic, itching and postpartum back pain. Confirmed with bedside nurse the patient's most recent platelet count. Confirmed with patient that they are not currently taking any anticoagulation, have any bleeding history or any family history of bleeding disorders. Patient expressed understanding and wished to proceed. All questions were answered. Sterile technique was used throughout the entire procedure. Please see nursing notes for vital signs. Test dose was given through epidural catheter and negative prior to continuing to dose epidural or start infusion. Warning signs of high  block given to the patient including shortness of breath, tingling/numbness in hands, complete motor block, or any concerning symptoms with instructions to call for help. Patient was given instructions on fall risk and not to get out of bed. All questions and concerns addressed with instructions to call with any issues or inadequate analgesia.   Patient tolerated the insertion well without immediate complications.Reason for block:procedure for pain

## 2020-07-19 LAB — CBC
HCT: 18.8 % — ABNORMAL LOW (ref 36.0–46.0)
HCT: 25.7 % — ABNORMAL LOW (ref 36.0–46.0)
Hemoglobin: 6.5 g/dL — ABNORMAL LOW (ref 12.0–15.0)
Hemoglobin: 8.6 g/dL — ABNORMAL LOW (ref 12.0–15.0)
MCH: 26.4 pg (ref 26.0–34.0)
MCH: 26.8 pg (ref 26.0–34.0)
MCHC: 34.6 g/dL (ref 30.0–36.0)
MCHC: 33.5 g/dL (ref 30.0–36.0)
MCV: 76.4 fL — ABNORMAL LOW (ref 80.0–100.0)
MCV: 80.1 fL (ref 80.0–100.0)
Platelets: 170 10*3/uL (ref 150–400)
Platelets: 213 10*3/uL (ref 150–400)
RBC: 2.46 MIL/uL — ABNORMAL LOW (ref 3.87–5.11)
RBC: 3.21 MIL/uL — ABNORMAL LOW (ref 3.87–5.11)
RDW: 15.1 % (ref 11.5–15.5)
RDW: 15.9 % — ABNORMAL HIGH (ref 11.5–15.5)
WBC: 11 10*3/uL — ABNORMAL HIGH (ref 4.0–10.5)
WBC: 10.8 10*3/uL — ABNORMAL HIGH (ref 4.0–10.5)
nRBC: 0 % (ref 0.0–0.2)
nRBC: 0 % (ref 0.0–0.2)

## 2020-07-19 LAB — PREPARE RBC (CROSSMATCH)

## 2020-07-19 LAB — VARICELLA ZOSTER ANTIBODY, IGG: Varicella IgG: <135 index — ABNORMAL LOW (ref >165)

## 2020-07-19 LAB — RUBELLA SCREEN: Rubella: 7.08 index (ref >0.99)

## 2020-07-19 MED ORDER — SODIUM CHLORIDE 0.9% IV SOLUTION: Freq: Once | INTRAVENOUS | Status: AC

## 2020-07-19 MED ORDER — DIPHENHYDRAMINE HCL 25 MG PO CAPS
25.0000 mg | ORAL_CAPSULE | Freq: Once | ORAL | Status: AC
  Administered 2020-07-19: 25 mg via ORAL
  Filled 2020-07-19: qty 1

## 2020-07-19 MED ORDER — DIPHENHYDRAMINE HCL 50 MG/ML IJ SOLN
25.0000 mg | Freq: Once | INTRAMUSCULAR | Status: AC
  Administered 2020-07-19: 25 mg via INTRAVENOUS

## 2020-07-19 MED ORDER — MAGNESIUM OXIDE 400 (241.3 MG) MG PO TABS
400.0000 mg | ORAL_TABLET | Freq: Every day | ORAL | Status: DC
  Administered 2020-07-19 – 2020-07-20 (×2): 400 mg via ORAL
  Filled 2020-07-19 (×3): qty 1

## 2020-07-19 MED ORDER — DIPHENHYDRAMINE HCL 50 MG/ML IJ SOLN
INTRAMUSCULAR | Status: AC
  Filled 2020-07-19: qty 1

## 2020-07-19 MED ORDER — FERROUS SULFATE 325 (65 FE) MG PO TABS
325.0000 mg | ORAL_TABLET | Freq: Two times a day (BID) | ORAL | Status: DC
  Administered 2020-07-19 – 2020-07-20 (×3): 325 mg via ORAL
  Filled 2020-07-19 (×2): qty 1

## 2020-07-19 MED ORDER — ALPRAZOLAM 0.5 MG PO TABS
0.5000 mg | ORAL_TABLET | Freq: Three times a day (TID) | ORAL | Status: DC | PRN
  Administered 2020-07-19 – 2020-07-20 (×4): 0.5 mg via ORAL
  Filled 2020-07-19 (×4): qty 1

## 2020-07-19 NOTE — Clinical Social Work Maternal (Signed)
CLINICAL SOCIAL WORK MATERNAL/CHILD NOTE  Patient Details  Name: MAKINLEY MUSCATO MRN: [id number] Date of Birth: 1986/11/18  Date:  07/19/2020  Clinical Social Worker Initiating Note:  Jhonnie Garner, RNCM Date/Time: Initiated:  07/19/20/1100     Child's Name:  Sylvester Harder   Biological Parents:  Mother   Need for Interpreter:  None   Reason for Referral:  Adoption, Current Substance Use/Substance Use During Pregnancy , Late or No Prenatal Care    Address:  [place] [place] [address]    Phone number:  [phone] (home)     Additional phone number:   Household Members/Support Persons (HM/SP):   Household Member/Support Person 1   HM/SP Name Relationship DOB or Age  HM/SP -30 Angela Hunter-Nordmeyer Mother    HM/SP -2        HM/SP -3        HM/SP -4        HM/SP -5        HM/SP -6        HM/SP -7        HM/SP -8          Natural Supports (not living in the home):  Immediate Family   Professional Supports:     Employment: Animator   Type of Work: Barrister's clerk   Education:  [place] arranged:    Museum/gallery curator Resources:  Self-Pay    Other Resources:      Cultural/Religious Considerations Which May Impact Care:    Strengths:  Pediatrician chosen   Psychotropic Medications:         Pediatrician:    Ambulance person List:   [place]      Pediatrician Fax Number:    Risk Factors/Current Problems:  Substance Use , Other (Comment)   Cognitive State:  Other (Comment) (Sleepy)   Mood/Affect:  Calm , Flat    CSW Assessment: RNCM met with MOB in room. MOB sleeping in bed with her mother present, holding and consoling baby. MOB able to wake up and answer some questions however remained sleepy throughout visit. Initial consult was obtained due to patient not wanting to keep baby and verbalizing that  her cousin was going to take it. UDS for baby was also positive for Benzodiazepines. RNCM explained role in hospital and what would be discussed. Grandmother Levada Dy was initially the only one involved in conversation but MOB woke up and answered questions as well. When questioned mother reports that she did not know she was pregnant, she reports that she began having abdominal pain and really thought it was her kidneys and that she may have had kidney stones. Discussed that initially patient had stated that she didn't want to take child home and that her cousin was going to take baby. MOB reports that this was initially the case but that now she wants to keep the baby. She has named her Tierany Appleby and grandmother Levada Dy reports that it is her plan to take them to her house to assist in caring for baby. Discussed with both MOB and grandmother that CPS report will be made and they will likely be following up with her either in the room or by phone. At this time MOB and grandmother are not verbalizing any other needs.  RNCM placed call to [place] and received call  back from [place]. Report was made and Chrissie Noa verbalized that this would be screened in they would be in contact.   CSW Plan/Description:  Child Copy Report , CSW Awaiting CPS Disposition Plan    Shelbie Ammons, RN 07/19/2020, 1:26 PM

## 2020-07-19 NOTE — Anesthesia Postprocedure Evaluation (Signed)
Anesthesia Post Note  Patient: Dawn Dixon  Procedure(s) Performed: AN AD HOC LABOR EPIDURAL  Patient location during evaluation: Mother Baby Anesthesia Type: Epidural Level of consciousness: awake, awake and alert and oriented Pain management: pain level controlled Vital Signs Assessment: post-procedure vital signs reviewed and stable Respiratory status: spontaneous breathing, respiratory function stable and nonlabored ventilation Cardiovascular status: blood pressure returned to baseline and stable Postop Assessment: no headache and no backache Anesthetic complications: no   No complications documented.   Last Vitals:  Vitals:   07/19/20 0500 07/19/20 0600  BP:    Pulse: 72 65  Resp:    Temp:    SpO2: 93% 94%    Last Pain:  Vitals:   07/19/20 0439  TempSrc: Oral  PainSc:                  [name]

## 2020-07-19 NOTE — Progress Notes (Signed)
Pt got up to bedside commode around 1400 and passed one baseball-size blood clot. Nurse weighed it and it was 7.6 oz (171 mL). No continued gushes upon fundal rub. Pt voided .   Colleen, CNM ordered 1 unit PRBC to be transfused. Transfusion started at 1440.

## 2020-07-19 NOTE — Progress Notes (Signed)
Post Partum Day 1 Subjective: up ad lib, voiding and tolerating PO . Tired, no rest overnight. Received 2 units of blood overnight-just completed around 0800 this AM.  Objective: Blood pressure 128/74, pulse 77, temperature 98.6 F (37 C), temperature source Oral, resp. rate 18, height 5\' 1"  (1.549 m), weight 70.3 kg, SpO2 100 %, unknown if currently breastfeeding.  Physical Exam:  General: sleeping when I went into room, kept eyes closed during exam Lochia: appropriate Uterine Fundus: firm/ U-1/slightly deviated to the left.   DVT Evaluation: No evidence of DVT seen on physical exam. Nonpitting edema in lower extremities.   Recent Labs    07/17/20 2307 07/18/20 2106  HGB 7.8* 6.3*  HCT 24.8* 20.1*  WBC 11.3* 18.1*  PLT 244 254    Assessment/Plan: Stable PPD #1 Chronic anemia, worsened with acute blood loss of delivery  S/p blood transfusion  CBC drawn now post transfusion  Vitamins and iron Baby may be placed for adoption/ no PNC/ +UDS for benzos and opiates  TOC/social services involved B pos/RI/VNI-offer Varivax at discharge Bottle Contraception:? TDAp-offer at discharge   LOS: 2 days   2107 07/19/2020, 12:42 PM

## 2020-07-19 NOTE — Discharge Instructions (Signed)
Discharge Instructions:   Follow-up Appointment: Tuesday, August 17th at 9:10am with Claris Che, CNM at [facility]!   If there are any new medications, they have been ordered and will be available for pickup at the listed pharmacy on your way home from the hospital.   Call office if you have any of the following: headache, visual changes, fever >101.0 F, chills, shortness of breath, breast concerns, excessive vaginal bleeding, incision drainage or problems, leg pain or redness, depression or any other concerns. If you have vaginal discharge with an odor, let your doctor know.   It is normal to bleed for up to 6 weeks. You should not soak through more than 1 pad in 1 hour. If you have a blood clot larger than your fist with continued bleeding, call your doctor.   Activity: Do not lift > 10 lbs for 6 weeks (do not lift anything heavier than your baby). No intercourse, tampons, swimming pools, hot tubs, baths (only showers) for 6 weeks.  No driving for 1-2 weeks. Continue prenatal vitamin, especially if breastfeeding. Increase calories and fluids (water) while breastfeeding.   Your milk will come in, in the next couple of days (right now it is colostrum). You may have a slight fever when your milk comes in, but it should go away on its own.  If it does not, and rises above 101 F please call the doctor. You will also feel achy and your breasts will be firm. They will also start to leak. If you are breastfeeding, continue as you have been and you can pump/express milk for comfort.   If you have too much milk, your breasts can become engorged, which could lead to mastitis. This is an infection of the milk ducts. It can be very painful and you will need to notify your doctor to obtain a prescription for antibiotics. You can also treat it with a shower or hot/cold compress.   For concerns about your baby, please call your pediatrician.  For breastfeeding concerns, the lactation consultant can be  reached at [phone].   Postpartum blues (feelings of happy one minute and sad another minute) are normal for the first few weeks but if it gets worse let your doctor know.   Congratulations! We enjoyed caring for you and your new bundle of joy!

## 2020-07-19 NOTE — Progress Notes (Signed)
Per night shift nurse IV infiltrated and blood administration (2nd unit) had to be paused until a new IV was placed.  IV placed at shift change by IV team and night and day shift nurse restarted the blood (with approval from management).   2nd unit of blood completed at 0820 and a total of infused. The transfusion report on Epic is incorrect due to the night shift nurse already putting in a total before she paused it after it infiltrated. Correct amount and time filed on green sheet in patient's chart.   Repeat blood draw to be completed at 1200 per [place], CNM.

## 2020-07-19 NOTE — Progress Notes (Signed)
Mother of baby and patient's mother given a detailed explanation of sign and symptoms of withdrawal in neonates. Urine bag was applied to infant after delivery and the purpose of the urine bag explained to the mother as well as when to call the nurse for assistance with bag. Eat, Sleep, Console reviewed with mother and patient's mother. RN explained what is normal infant activity and shown that the baby does currently have jitteriness, increased muscle tone, and poor feeding. RN explained that these symptoms are present currently and may increase over the next few days. Length of stay reviewed with mother and possibilty of admission to the SC[facility] if eat, sleep, console isnt working or mother is unable to care for infant. Mother tearful after discussion and RN comforted mother and encouraged her in the care of her infant with possible withdrawal. Mother given pamphlet as well on eat, sleep, console and infants with withdrawal symptoms.

## 2020-07-20 ENCOUNTER — Inpatient Hospital Stay: Payer: Medicaid Other

## 2020-07-20 DIAGNOSIS — D649 Anemia, unspecified: Secondary | ICD-10-CM | POA: Diagnosis present

## 2020-07-20 DIAGNOSIS — F32A Depression, unspecified: Secondary | ICD-10-CM | POA: Diagnosis present

## 2020-07-20 DIAGNOSIS — R609 Edema, unspecified: Secondary | ICD-10-CM | POA: Diagnosis present

## 2020-07-20 LAB — BPAM RBC
Blood Product Expiration Date: 202108162359
Blood Product Expiration Date: 202108172359
Blood Product Expiration Date: 202108282359
ISSUE DATE / TIME: 202108030046
ISSUE DATE / TIME: 202108030415
ISSUE DATE / TIME: 202108031429
Unit Type and Rh: 1700
Unit Type and Rh: 1700
Unit Type and Rh: 7300

## 2020-07-20 LAB — TYPE AND SCREEN
ABO/RH(D): B POS
Antibody Screen: NEGATIVE
Unit division: 0
Unit division: 0
Unit division: 0

## 2020-07-20 LAB — COMPREHENSIVE METABOLIC PANEL
ALT: 19 U/L (ref 0–44)
AST: 25 U/L (ref 15–41)
Albumin: 2.3 g/dL — ABNORMAL LOW (ref 3.5–5.0)
Alkaline Phosphatase: 133 U/L — ABNORMAL HIGH (ref 38–126)
Anion gap: 6 (ref 5–15)
BUN: <5 mg/dL — ABNORMAL LOW (ref 6–20)
CO2: 25 mmol/L (ref 22–32)
Calcium: 8 mg/dL — ABNORMAL LOW (ref 8.9–10.3)
Chloride: 106 mmol/L (ref 98–111)
Creatinine, Ser: 0.58 mg/dL (ref 0.44–1.00)
GFR calc Af Amer: >60 mL/min (ref >60)
GFR calc non Af Amer: >60 mL/min (ref >60)
Glucose, Bld: 75 mg/dL (ref 70–99)
Potassium: 3.5 mmol/L (ref 3.5–5.1)
Sodium: 137 mmol/L (ref 135–145)
Total Bilirubin: 0.8 mg/dL (ref 0.3–1.2)
Total Protein: 5.2 g/dL — ABNORMAL LOW (ref 6.5–8.1)

## 2020-07-20 LAB — PROTEIN / CREATININE RATIO, URINE
Creatinine, Urine: 215 mg/dL
Protein Creatinine Ratio: 0.17 mg/mg{Cre} — ABNORMAL HIGH (ref 0.00–0.15)
Total Protein, Urine: 36 mg/dL

## 2020-07-20 LAB — SURGICAL PATHOLOGY

## 2020-07-20 LAB — TSH: TSH: 3.144 u[IU]/mL (ref 0.350–4.500)

## 2020-07-20 MED ORDER — ESCITALOPRAM OXALATE 5 MG PO TABS: 5.0000 mg | ORAL_TABLET | Freq: Every day | ORAL | 1 refills | Status: AC

## 2020-07-20 MED ORDER — IBUPROFEN 600 MG PO TABS: 600.0000 mg | ORAL_TABLET | Freq: Four times a day (QID) | ORAL | 0 refills | Status: DC | PRN

## 2020-07-20 MED ORDER — PRENATAL MULTIVITAMIN CH: 1.0000 | ORAL_TABLET | Freq: Every day | ORAL | 3 refills | Status: DC

## 2020-07-20 MED ORDER — ALPRAZOLAM ER 0.5 MG PO TB24: 0.5000 mg | ORAL_TABLET | Freq: Two times a day (BID) | ORAL | 0 refills | Status: AC | PRN

## 2020-07-20 MED ORDER — MEDROXYPROGESTERONE ACETATE 150 MG/ML IM SUSP
150.0000 mg | Freq: Once | INTRAMUSCULAR | Status: AC
  Administered 2020-07-20: 150 mg via INTRAMUSCULAR
  Filled 2020-07-20: qty 1

## 2020-07-20 MED ORDER — FERROUS SULFATE 325 (65 FE) MG PO TABS: 325.0000 mg | ORAL_TABLET | Freq: Two times a day (BID) | ORAL | 1 refills | Status: AC

## 2020-07-20 MED ORDER — ESCITALOPRAM OXALATE 10 MG PO TABS
5.0000 mg | ORAL_TABLET | Freq: Every day | ORAL | Status: DC
  Administered 2020-07-20: 5 mg via ORAL
  Filled 2020-07-20: qty 1

## 2020-07-20 MED ORDER — VARICELLA VIRUS VACCINE LIVE 1350 PFU/0.5ML IJ SUSR
0.5000 mL | Freq: Once | INTRAMUSCULAR | Status: AC
  Administered 2020-07-20: 0.5 mL via SUBCUTANEOUS
  Filled 2020-07-20 (×2): qty 0.5

## 2020-07-20 MED ORDER — HYDROCHLOROTHIAZIDE 12.5 MG PO CAPS
12.5000 mg | ORAL_CAPSULE | Freq: Every day | ORAL | Status: DC
  Administered 2020-07-20: 12.5 mg via ORAL
  Filled 2020-07-20: qty 1

## 2020-07-20 MED ORDER — HYDROCHLOROTHIAZIDE 12.5 MG PO CAPS: 12.5000 mg | ORAL_CAPSULE | Freq: Every day | ORAL | 0 refills | Status: AC

## 2020-07-20 NOTE — Progress Notes (Signed)
Patient arrived on unit at this time. States she "forgot what the plan was." Patient is accompanied by Jerrel Ivory (maternal aunt of infant).

## 2020-07-20 NOTE — Progress Notes (Addendum)
RN had gone into room at 1256 to introduce self to patient as RN was taking over care for M.Edge RN. Collected urine for PC ratio and discussed plan with patient that RN was awaiting confirmation from C.Gutierrez CNM for prescriptions to be sent to [facility] and vaccines and Depo shot were being ordered and awaiting pharmacy to send them. Discharge instructions would be completed after these tasks completed.  Patient verbalized understanding at this time. RN went into room for hourly rounding around 1340 and patient was in room with MGM and infant, stated no needs at that time, RN reiterated plan of care and patient agreed. RN went into room at 1440 to find MGM with infant and the patient was gone. MGM stated that patient left to to go [employer] to get items for infant to stay for ESC protocol and medications. RN requested MGM phone patient to come back to hospital immediately as patient had not been discharged. Patient left with a PIV in Left forearm that was intact prior to leaving without discharge instructions and paperwork. Awaiting patient arrival at this time

## 2020-07-20 NOTE — Progress Notes (Signed)
RN was requested by patient to return CBD vape that was taken by hospital staff on 8/2 (see note). Patient being discharged, RN located vapor and returned to patient at this time.

## 2020-07-20 NOTE — Progress Notes (Addendum)
Post Partum Day 1 Subjective: up ad lib, voiding and tolerating PO. Has decided to keep baby.  Feels more energetic since third unit of blood infused yesterday. Concerned over her extensive edema of her lower extremities. The edema began about 3 months ago. It is a little better overnight. Reports that a PCP she used to have in [place] was prescribing her Xanax for her anxiety. Has not seen him in a long while. Was being followed at [facility]-last seen about 4 months ago.   Objective: Blood pressure 127/74, pulse 65, temperature 97.6 F (36.4 C), temperature source Oral, resp. rate 20, height 5\' 1"  (1.549 m), weight 70.3 kg, SpO2 99 %, unknown if currently breastfeeding. Occasionally has a mild range blood pressure: Patient Vitals for the past 24 hrs:  BP Temp Temp src Pulse Resp SpO2  07/20/20 0809 127/74 97.6 F (36.4 C) Oral 65 20 99 %  07/20/20 0315 135/79 98 F (36.7 C) -- (!) 58 18 97 %  07/19/20 2358 129/77 97.7 F (36.5 C) Oral (!) 54 18 100 %  07/19/20 2017 (!) 146/95 98.1 F (36.7 C) Oral 74 18 100 %  07/19/20 1704 (!) 144/87 98.2 F (36.8 C) Oral 73 17 100 %  07/19/20 1455 (!) 134/58 98.2 F (36.8 C) Oral (!) 56 18 98 %  07/19/20 1419 (!) 148/74 98.3 F (36.8 C) Oral 60 18 98 %  07/19/20 1324 134/85 98 F (36.7 C) Oral (!) 58 20 99 %   Physical Exam:  General: alert, cooperative and no distress  Heart: RRR without murmur Lungs CTAB, normal respiratory effort Lochia: appropriate Uterine Fundus: firm/ at U/ML/NT  DVT Evaluation: No evidence of DVT seen on physical exam. Significant non pitting edema of legs that extends to thighs Recent Labs    07/19/20 1244 07/19/20 2212  HGB 6.5* 8.6*  HCT 18.8* 25.7*  WBC 11.0* 10.8*  PLT 170 213    Assessment/Plan: Chronic anemia, worsened with acute blood loss of delivery             S/p blood transfusion x 3 units              Vitamins and iron  no PNC/ +UDS for benzos and opiates/ no insurance             TOC/social  services/DSS involved Bilateral nonpitting edema of lower extremities  Will get bilateral Doppler studies to rule out DVT  Repeat CMP, PC ratio and get TSH B pos/RI/VNI-offer Varivax at discharge Bottle Contraception:Depo TDAp-offer at discharge May be discharged later today. Baby will be staying for Eat, Sleep, Console program    LOS: 3 days   June 07/20/2020, 9:14 AM

## 2020-07-20 NOTE — Discharge Planning (Signed)
RN reviewed discharge instructions with patient, medications reviewed and where to retrieve medications. Postpartum care and postpartum blues/depression reviewed with patient at length d/t patient's history of anxiety. Patient verbalized understanding of all information reviewed. Patient will be staying with infant for ESC protocol with patient's mother. RN expressed importance of abstaining from using the vape stick that was returned to her possession as it was against hospital policy. Patient verbalized understanding and placed vape stick into purse.

## 2020-07-20 NOTE — TOC Initial Note (Signed)
Transition of Care [facility]) - Initial/Assessment Note    Patient Details  Name: Dawn Dixon MRN: [id number] Date of Birth: 08-28-1986  Transition of Care [facility]) CM/SW Contact:    Almyra Cellar, RN Phone Number: 07/20/2020, 1:36 PM  Clinical Narrative:                 Spoke with patient at bedside with grandmother and DSS-CPS-SW, Excell Seltzer [phone] present. Patient has decided she wants to keep the infant and will be living with her father. The grandmother will be assisting as well as patients sister. Sister has gone to purchase some supplies for infant including car seat. CPS will be following mother and infant after discharge due to (+) UDS and no prenatal care. Patient will be bottle feeding and was provided information for Medicaid and [facility] services enrollment. Discussed medications being sent to Medication Mgmt at discharge with the exception of Xanax and pain medication. Pain verbalized understanding and states she [facility]. Provider is planning to work with patient to wean Xanax and begin Lexapro. Discussed PPD/SIDS and importance of hand hygiene around infant. Patient and grandmother state no other needs and DSS-CPS-SW will continue assessment.   RN CM discussed with Farrel Conners, CNM and confirmed medications being sent to med mgmt.        Patient Goals and CMS Choice        Expected Discharge Plan and Services           Expected Discharge Date: 07/20/20                                    Prior Living Arrangements/Services                       Activities of Daily Living Home Assistive Devices/Equipment: None ADL Screening (condition at time of admission) Patient's cognitive ability adequate to safely complete daily activities?: Yes Is the patient deaf or have difficulty hearing?: No Does the patient have difficulty seeing, even when wearing glasses/contacts?: No Does the patient have difficulty concentrating, remembering, or making  decisions?: No Patient able to express need for assistance with ADLs?: Yes Does the patient have difficulty dressing or bathing?: No Independently performs ADLs?: Yes (appropriate for developmental age) Does the patient have difficulty walking or climbing stairs?: No Weakness of Legs: None Weakness of Arms/Hands: None  Permission Sought/Granted                  Emotional Assessment              Admission diagnosis:  No prenatal care in current pregnancy in third trimester [O09.33] Patient Active Problem List   Diagnosis Date Noted  . Anxiety and depression 07/20/2020  . Severe anemia 07/20/2020  . Non-pitting edema 07/20/2020  . Single liveborn infant delivered vaginally   . Postpartum hemorrhage, third stage   . No prenatal care in current pregnancy in third trimester 07/17/2020  . BMI 31.0-31.9,adult 02/07/2013   PCP:  Jacquenette Shone, NP Pharmacy:   [facility] [phone] [place] Rough, [place] - [address] [place] [address] Phone: [phone] Fax: [phone]  [facility] - [place], [place] - [address] [place] [address] Phone: [phone] Fax: [phone]     Social Determinants of Health (SDOH) Interventions    Readmission Risk Interventions No  flowsheet data found.

## 2020-07-21 ENCOUNTER — Ambulatory Visit: Payer: Self-pay

## 2020-07-21 NOTE — Lactation Note (Signed)
This note was copied from a baby's chart. Lactation Consultation Note  Patient Name: Girl Ivett Luebbe [id number] Date: 07/21/2020   Mom rooming in with Lindon who is an [facility] patient.  Mom was positive for opiates and benzodiazepines.  Mom was unsure if she was going to even keep the baby at one time.  She has now decided to keep [place].  She has no intention of breast feeding or pumping.  She is only bottle feeding formula.   Hand out given on Infant Formula Preparation and reviewed storage, sanitizing of feeding and preparation equipment, mixing powdered formula safely with water and protecting Giz[employer] from [employer].  Explained how to dry up milk by wearing well fitting supportive bra, using cold and/or cabbage leaves and avoiding warmth, stimulation or extracting any breast milk.  Discussed differences, prevention and treatment of full breasts, engorgement, plugged ducts and mastitis and when to seek further assistance from physician.  Encouraged mom to call with any questions or concerns.  Maternal Data    Feeding    LATCH Score                   Interventions    Lactation Tools Discussed/Used     Consult Status      Louis Meckel 07/21/2020, 8:00 PM

## 2020-08-02 ENCOUNTER — Ambulatory Visit: Payer: Self-pay | Admitting: Obstetrics

## 2020-11-08 ENCOUNTER — Telehealth: Payer: Self-pay | Admitting: Pharmacist

## 2020-11-08 NOTE — Telephone Encounter (Signed)
Patient failed to provide requested 2021 financial documentation. No additional medication assistance will be provided by [facility] without the required proof of income documentation. Patient notified by letter Debra Cheek Administrative Assistant [facility] 

## 2021-04-03 IMAGING — US US EXTREM LOW VENOUS
1 series · 13 of 24 positions shown · non-contrast
Comparison: None.

CLINICAL DATA: Bilateral peripheral edema for 3 months, 2 days
postpartum



[Series 1: us venous img lower bilat (dvt) · portal-venous · 13 of 59 slices shown]
[im 1/59]
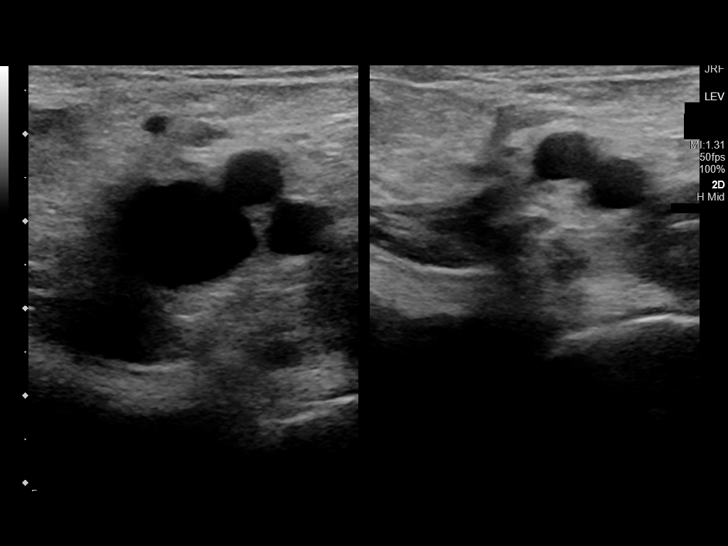
[im 6/59]
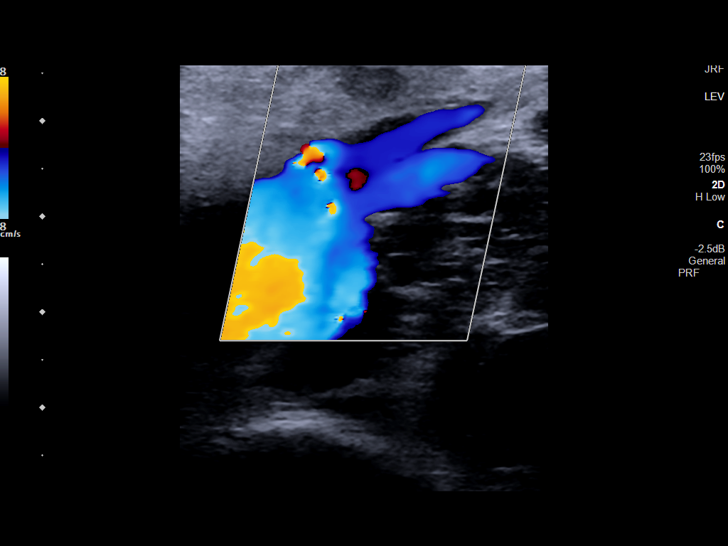
[im 11/59]
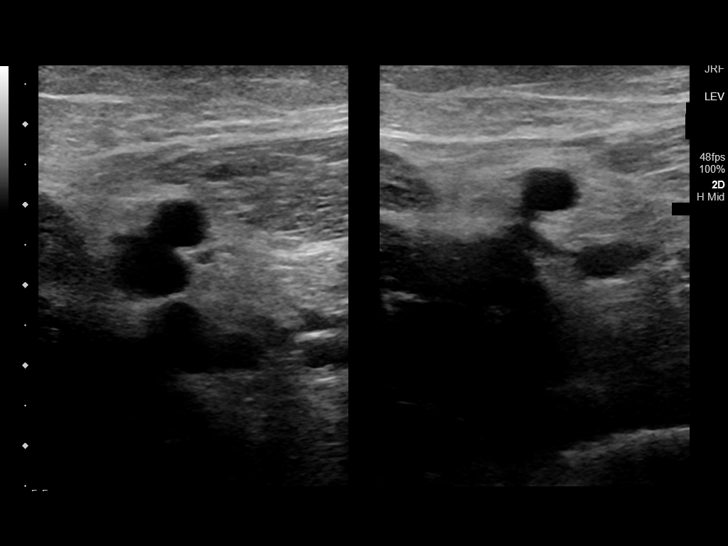
[im 16/59]
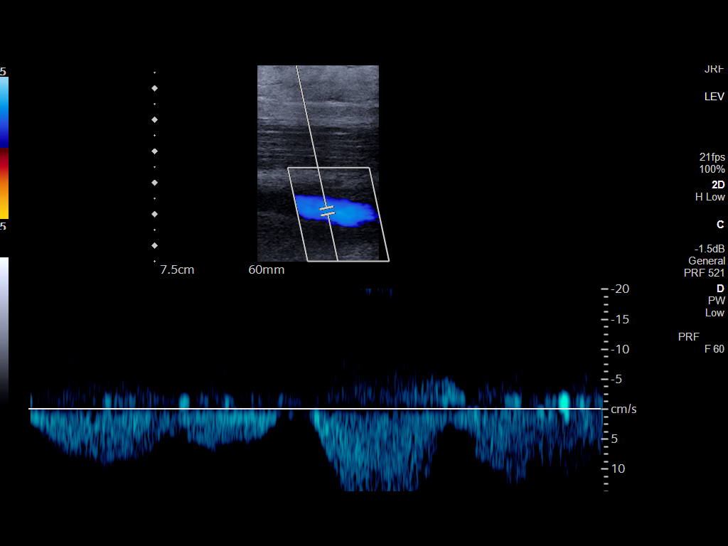
[im 21/59]
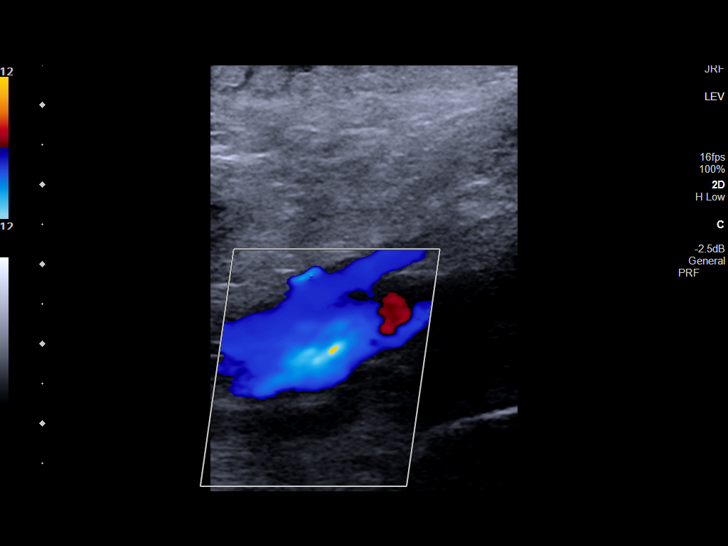
[im 26/59]
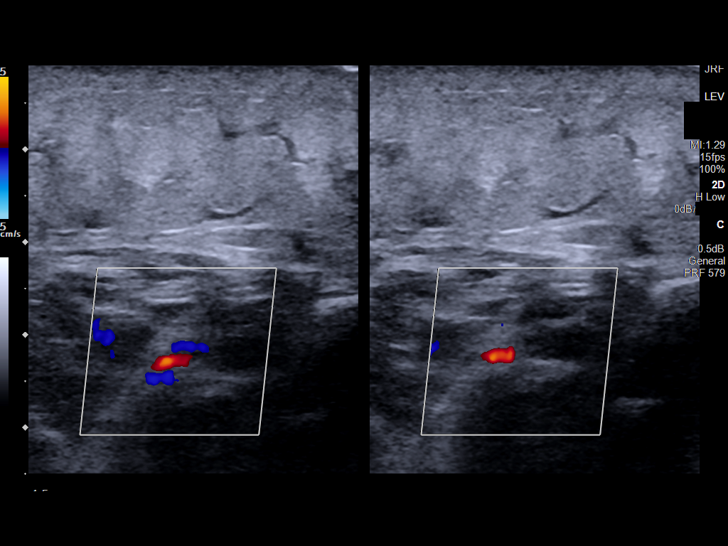
[im 31/59]
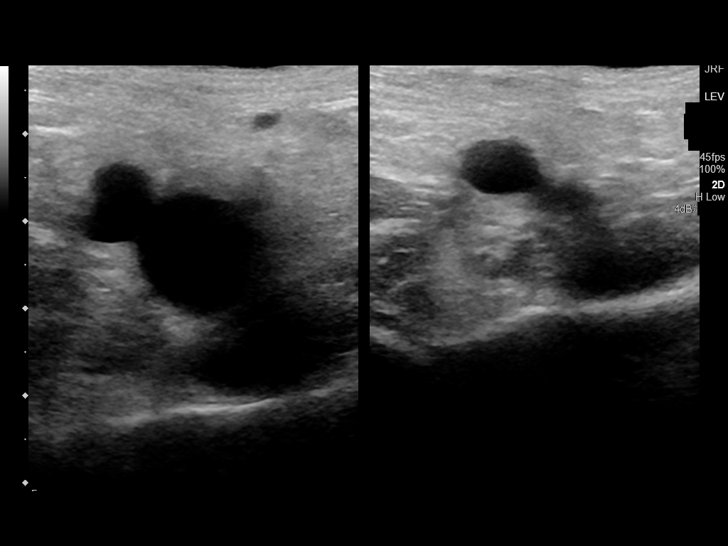
[im 33/59]
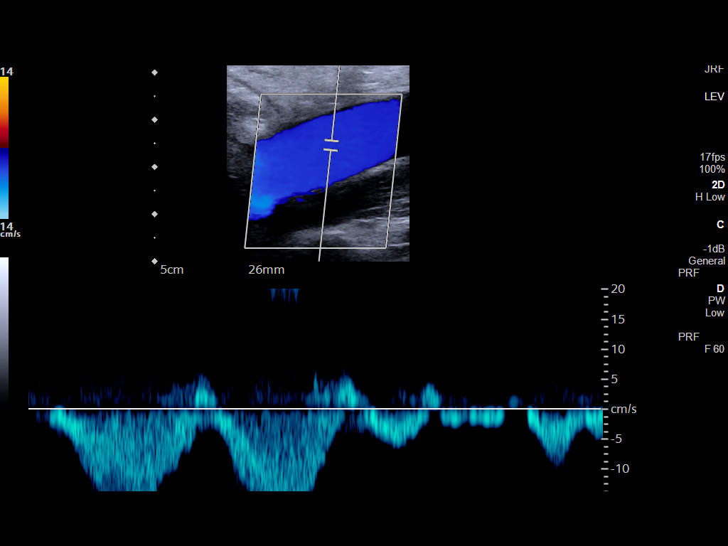
[im 38/59]
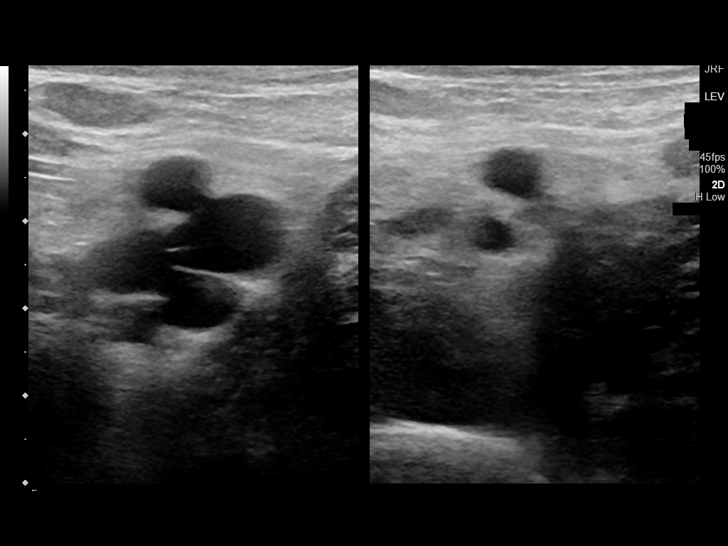
[im 43/59]
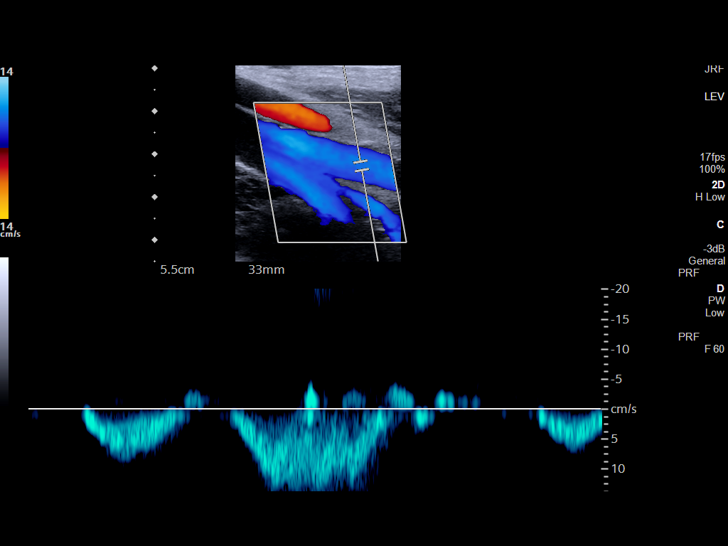
[im 48/59]
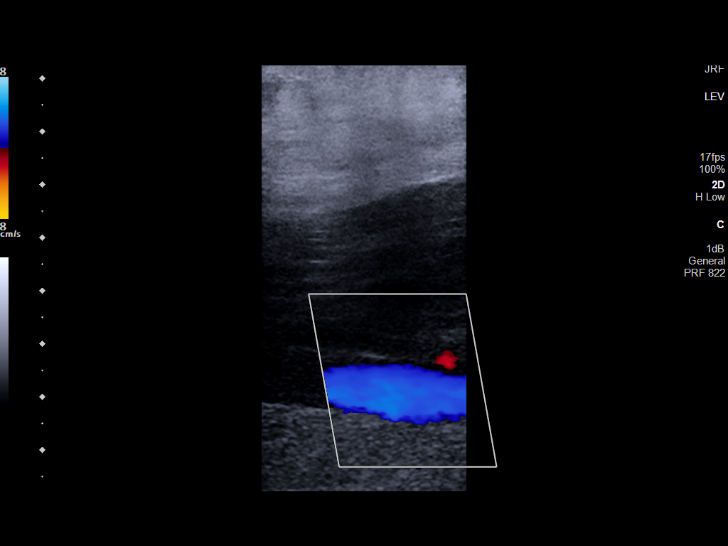
[im 53/59]
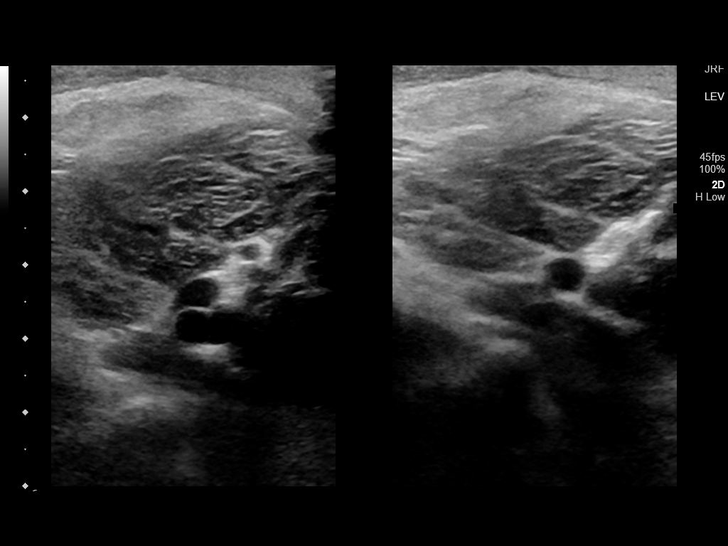
[im 59/59]
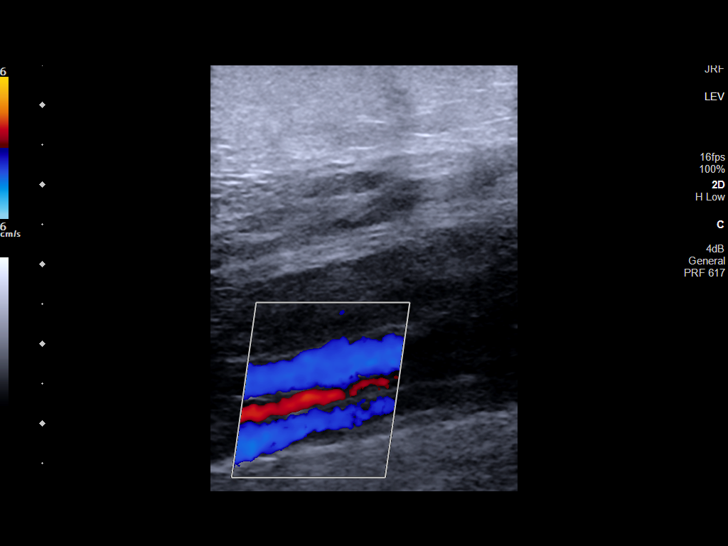

[13 of 24 positions shown; findings below may reference images not displayed]

FINDINGS: RIGHT LOWER EXTREMITY

Common Femoral Vein: No evidence of thrombus. Normal
compressibility, respiratory phasicity and response to augmentation.

Saphenofemoral Junction: No evidence of thrombus. Normal
compressibility and flow on color Doppler imaging.

Profunda Femoral Vein: No evidence of thrombus. Normal
compressibility and flow on color Doppler imaging.

Femoral Vein: No evidence of thrombus. Normal compressibility,
respiratory phasicity and response to augmentation.

Popliteal Vein: No evidence of thrombus. Normal compressibility,
respiratory phasicity and response to augmentation.

Calf Veins: No evidence of thrombus. Normal compressibility and flow
on color Doppler imaging.

LEFT LOWER EXTREMITY

Common Femoral Vein: No evidence of thrombus. Normal
compressibility, respiratory phasicity and response to augmentation.

Saphenofemoral Junction: No evidence of thrombus. Normal
compressibility and flow on color Doppler imaging.

Profunda Femoral Vein: No evidence of thrombus. Normal
compressibility and flow on color Doppler imaging.

Femoral Vein: No evidence of thrombus. Normal compressibility,
respiratory phasicity and response to augmentation.

Popliteal Vein: No evidence of thrombus. Normal compressibility,
respiratory phasicity and response to augmentation.

Calf Veins: No evidence of thrombus. Normal compressibility and flow
on color Doppler imaging.
IMPRESSION: No evidence of deep venous thrombosis in either lower extremity.

## 2022-03-08 ENCOUNTER — Other Ambulatory Visit: Payer: Self-pay

## 2022-03-08 ENCOUNTER — Emergency Department
Admission: EM | Admit: 2022-03-08 | Discharge: 2022-03-09 | Disposition: A | Discharge status: Other Acute Inpatient Hospital | Payer: Medicaid Other | Attending: Emergency Medicine | Admitting: Emergency Medicine

## 2022-03-08 DIAGNOSIS — F19230 Other psychoactive substance dependence with withdrawal, uncomplicated: Secondary | ICD-10-CM | POA: Diagnosis present

## 2022-03-08 DIAGNOSIS — F309 Manic episode, unspecified: Secondary | ICD-10-CM | POA: Diagnosis not present

## 2022-03-08 DIAGNOSIS — Z20822 Contact with and (suspected) exposure to covid-19: Secondary | ICD-10-CM | POA: Diagnosis not present

## 2022-03-08 DIAGNOSIS — Z046 Encounter for general psychiatric examination, requested by authority: Secondary | ICD-10-CM | POA: Insufficient documentation

## 2022-03-08 LAB — COMPREHENSIVE METABOLIC PANEL
ALT: 23 U/L (ref 0–44)
AST: 31 U/L (ref 15–41)
Albumin: 5.2 g/dL — ABNORMAL HIGH (ref 3.5–5.0)
Alkaline Phosphatase: 82 U/L (ref 38–126)
Anion gap: 12 (ref 5–15)
BUN: 6 mg/dL (ref 6–20)
CO2: 27 mmol/L (ref 22–32)
Calcium: 10 mg/dL (ref 8.9–10.3)
Chloride: 100 mmol/L (ref 98–111)
Creatinine, Ser: 0.9 mg/dL (ref 0.44–1.00)
GFR, Estimated: >60 mL/min (ref >60)
Glucose, Bld: 121 mg/dL — ABNORMAL HIGH (ref 70–99)
Potassium: 3.2 mmol/L — ABNORMAL LOW (ref 3.5–5.1)
Sodium: 139 mmol/L (ref 135–145)
Total Bilirubin: 3.4 mg/dL — ABNORMAL HIGH (ref 0.3–1.2)
Total Protein: 9.1 g/dL — ABNORMAL HIGH (ref 6.5–8.1)

## 2022-03-08 LAB — CBC
HCT: 43.6 % (ref 36.0–46.0)
Hemoglobin: 14.6 g/dL (ref 12.0–15.0)
MCH: 27.3 pg (ref 26.0–34.0)
MCHC: 33.5 g/dL (ref 30.0–36.0)
MCV: 81.5 fL (ref 80.0–100.0)
Platelets: 301 10*3/uL (ref 150–400)
RBC: 5.35 MIL/uL — ABNORMAL HIGH (ref 3.87–5.11)
RDW: 13.2 % (ref 11.5–15.5)
WBC: 7.1 10*3/uL (ref 4.0–10.5)
nRBC: 0 % (ref 0.0–0.2)

## 2022-03-08 LAB — RESP PANEL BY RT-PCR (FLU A&B, COVID) ARPGX2
Influenza A by PCR: NEGATIVE
Influenza B by PCR: NEGATIVE
SARS Coronavirus 2 by RT PCR: NEGATIVE

## 2022-03-08 LAB — ETHANOL: Alcohol, Ethyl (B): <10 mg/dL (ref <10)

## 2022-03-08 LAB — ACETAMINOPHEN LEVEL: Acetaminophen (Tylenol), Serum: <10 ug/mL — ABNORMAL LOW (ref 10–30)

## 2022-03-08 LAB — SALICYLATE LEVEL: Salicylate Lvl: <7 mg/dL — ABNORMAL LOW (ref 7.0–30.0)

## 2022-03-08 MED ORDER — OLANZAPINE 5 MG PO TBDP
10.0000 mg | ORAL_TABLET | Freq: Once | ORAL | Status: DC
  Filled 2022-03-08: qty 2

## 2022-03-08 MED ORDER — ZIPRASIDONE MESYLATE 20 MG IM SOLR
20.0000 mg | Freq: Once | INTRAMUSCULAR | Status: AC
  Administered 2022-03-08: 20 mg via INTRAMUSCULAR
  Filled 2022-03-08: qty 20

## 2022-03-08 MED ORDER — LORAZEPAM 1 MG PO TABS
1.0000 mg | ORAL_TABLET | Freq: Once | ORAL | Status: AC
  Administered 2022-03-08: 1 mg via ORAL
  Filled 2022-03-08: qty 1

## 2022-03-08 MED ORDER — ONDANSETRON 4 MG PO TBDP
4.0000 mg | ORAL_TABLET | Freq: Once | ORAL | Status: AC
  Administered 2022-03-08: 4 mg via ORAL

## 2022-03-08 MED ORDER — NICOTINE 21 MG/24HR TD PT24
21.0000 mg | MEDICATED_PATCH | Freq: Every day | TRANSDERMAL | Status: DC
  Administered 2022-03-08: 21 mg via TRANSDERMAL
  Filled 2022-03-08: qty 1

## 2022-03-08 NOTE — ED Notes (Signed)
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 EDP made aware.  ?

## 2022-03-08 NOTE — BH Assessment (Signed)
Writer received phone call from [facility]) stating the patient was accepted to [facility]) for tomorrow after (03/09/2022) 8am. Writer called and spoke with [facility] and confirmed it. ? ?Patient has been accepted to [facility]  ?Patient assigned to [place]. ?Accepting physician is Dr. Forrestine Him.  ?Call report to [phone].  ?Representative was [place].  ? ?ER Staff is aware of it:  ?Chiropractor  ?Jolene Provost, Patient's Nurse ? ? ?Address: ?[address],  ?[place], [place] [address] ? ?Patient available tomorrow (03/09/2022) after 8am. ?

## 2022-03-08 NOTE — ED Notes (Addendum)
Pt given cup of water per request. Pt also asked this tech if she could have "some oxycodone". EDP Charlsie Quest) made aware of request. ?

## 2022-03-08 NOTE — ED Notes (Addendum)
Pt belongings: ? ?1 brown hat ?1 gray sweater ?1 black shirt ?1 pair of black pants ?1 pair of brown sandals ?1 pair of black underwear ?1 black bra ?1 pair of silver earrings ?1 brown wig ?

## 2022-03-08 NOTE — ED Notes (Signed)
IVC patient has bed at [facility] 03/09/22 after 0800 ?

## 2022-03-08 NOTE — ED Provider Notes (Signed)
? ?[facility] ?Provider Note ? ? ? Event Date/Time  ? First MD Initiated Contact with Patient 03/08/22 1541   ?  (approximate) ? ? ?History  ? ?IVC ? ? ?HPI ? ?Dawn Dixon is a 36 y.o. female who presents to the ED for evaluation of IVC ?  ?I review obstetric discharge summary from 8/2. ? ?Patient presents to the ED under IVC from [facility] for evaluation of her mental health.  She was reportedly at a [employer] with her 69-month-old child in the back of a car.  She was agitated and climbing that she was going to sacrifice her child to appease God.  Patient's father has custody and has been looking for the patient since 430 this morning as she ran off with the baby. ? ?Physical Exam  ? ?Triage Vital Signs: ?ED Triage Vitals  ?Enc Vitals Group  ?   BP 03/08/22 1531 (!) 126/92  ?   Pulse Rate 03/08/22 1531 85  ?   Resp 03/08/22 1531 16  ?   Temp 03/08/22 1531 98.8 ?F (37.1 ?C)  ?   Temp Source 03/08/22 1531 Oral  ?   SpO2 03/08/22 1531 94 %  ?   Weight 03/08/22 1531 155 lb (70.3 kg)  ?   Height 03/08/22 1531 5\' 1"  (1.549 m)  ?   Head Circumference --   ?   Peak Flow --   ?   Pain Score 03/08/22 1531 0  ?   Pain Loc --   ?   Pain Edu? --   ?   Excl. in GC? --   ? ? ?Most recent vital signs: ?Vitals:  ? 03/08/22 1531  ?BP: (!) 126/92  ?Pulse: 85  ?Resp: 16  ?Temp: 98.8 ?F (37.1 ?C)  ?SpO2: 94%  ? ? ?General: Awake, no distress.  ?CV:  Good peripheral perfusion.  ?Resp:  Normal effort.  ?Abd:  No distention.  ?MSK:  No deformity noted.  ?Neuro:  No focal deficits appreciated. ?Other:   ? ? ?ED Results / Procedures / Treatments  ? ?Labs ?(all labs ordered are listed, but only abnormal results are displayed) ?Labs Reviewed  ?COMPREHENSIVE METABOLIC PANEL - Abnormal; Notable for the following components:  ?    Result Value  ? Potassium 3.2 (*)   ? Glucose, Bld 121 (*)   ? Total Protein 9.1 (*)   ? Albumin 5.2 (*)   ? Total Bilirubin 3.4 (*)   ? All other components within normal limits  ?CBC -  Abnormal; Notable for the following components:  ? RBC 5.35 (*)   ? All other components within normal limits  ?RESP PANEL BY RT-PCR (FLU A&B, COVID) ARPGX2  ?ETHANOL  ?SALICYLATE LEVEL  ?ACETAMINOPHEN LEVEL  ?URINE DRUG SCREEN, QUALITATIVE ([facility] ONLY)  ?POC URINE PREG, ED  ? ? ?EKG ? ? ?RADIOLOGY ? ? ?Official radiology report(s): ?No results found. ? ?PROCEDURES and INTERVENTIONS: ? ?Procedures ? ?Medications - No data to display ? ? ?IMPRESSION / MDM / ASSESSMENT AND PLAN / ED COURSE  ?I reviewed the triage vital signs and the nursing notes. ? ?36 year old female presents to the ED under IVC for evaluation of manic behavior.  She is somewhat elevated on examination, but not distressed.  No current indications for intramuscular calming agents at this point.  We will send screening labs.  [facility] is [facility] to be transported there tomorrow.  We will hold her here under IVC until then.  Consult with psychiatry. ? ?Clinical Course as of 03/08/22 1611  ?Thu Mar 08, 2022  ?1555 The patient has been placed in psychiatric observation due to the need to provide a safe environment for the patient while obtaining psychiatric consultation and evaluation, as well as ongoing medical and medication management to treat the patient's condition.  The patient has been placed under full IVC at this time. ? ? [DS]  ?  ?Clinical Course User Index ?[DS] Delton Prairie, MD  ? ? ? ?FINAL CLINICAL IMPRESSION(S) / ED DIAGNOSES  ? ?Final diagnoses:  ?Involuntary commitment  ? ? ? ?Rx / DC Orders  ? ?ED Discharge Orders   ? ? None  ? ?  ? ? ? ?Note:  This document was prepared using Dragon voice recognition software and may include unintentional dictation errors. ?  ?Delton Prairie, MD ?03/08/22 1612 ? ?

## 2022-03-08 NOTE — ED Triage Notes (Signed)
Pt here under IVC from [employer] with hyper religious ideations. Pt was found in a parking lot with her 90 month old (not strapped in the car seat) when she got out of the car and fell to the ground stating "See I healed myself" and " I was giving my child to God". Pt stable in triage. ?

## 2022-03-08 NOTE — ED Notes (Signed)
Pt called this tech over to ask for oxycodone. This tech told pt EDP said no to the medication. Pt stated "I need help with my withdrawals please help me please". This tech made RN Selena Batten and EDP aware. ?

## 2022-03-09 LAB — HCG, QUANTITATIVE, PREGNANCY: hCG, Beta Chain, Quant, S: <1 m[IU]/mL (ref <5)

## 2022-03-09 MED ORDER — CLONIDINE HCL 0.1 MG/24HR TD PTWK
0.1000 mg | MEDICATED_PATCH | TRANSDERMAL | Status: DC
Start: 2022-03-09 — End: 2022-03-09
  Administered 2022-03-09: 0.1 mg via TRANSDERMAL
  Filled 2022-03-09: qty 1

## 2022-03-09 MED ORDER — SODIUM CHLORIDE 0.9 % IV BOLUS
1000.0000 mL | Freq: Once | INTRAVENOUS | Status: AC
  Administered 2022-03-09: 1000 mL via INTRAVENOUS

## 2022-03-09 MED ORDER — LOPERAMIDE HCL 2 MG PO CAPS
4.0000 mg | ORAL_CAPSULE | Freq: Once | ORAL | Status: AC
  Administered 2022-03-09: 4 mg via ORAL
  Filled 2022-03-09: qty 2

## 2022-03-09 NOTE — ED Notes (Signed)
EMTALA reviewed by this RN.  

## 2022-03-09 NOTE — ED Notes (Signed)
Pt discharged under IVC to [facility].  1 belongings bag sent with officer.  

## 2022-03-09 NOTE — ED Notes (Signed)
[place]  [facility]  called  for  transport  to  [facility] 

## 2022-03-09 NOTE — ED Provider Notes (Signed)
Patient is complaining of withdrawal symptoms.  She is having nausea vomiting and diarrhea.  She has pooped all over the room.  She has Zofran ordered I will order her some Imodium and some clonidine patch.  Hopefully this will help.  I will also order her some saline IV since she is vomiting and having watery diarrhea I do not want her to get dehydrated. ?  ?Arnaldo Natal, MD ?03/09/22 0038 ? ?

## 2022-03-09 NOTE — ED Notes (Signed)
Patient come to this writer asking for help, "I am withdrawing from heroin.Please help me, can you give me a oxycodone." Patient was educated that Rn could not give medications unless EDP ordered and I would make EDP aware of patient withdrawing. Patient was given PO zofran per order.  ED tech went to obtain vitals and patient pulse was on lower side. Writer went to recheck pulse and patient had vomited and had diarrhea on floor in room. This Clinical research associate and ED tech cleaned patient and room up. EDP made aware. Orders obtained for withdraw symptoms. Writer was able to obtain IV access and start fluids, patient was given PO order 4 mg Imodium. Patient spit on capsule out and chewed other capsule. Patient currently laying bed. Patient complaining of stomach cramps. Writer explain to patient those are the symptoms of withdraw, and the medications that have been given will help.  ?

## 2022-03-09 NOTE — ED Notes (Signed)
Pt given breakfast tray and drink 

## 2022-03-09 NOTE — ED Notes (Signed)
Officer here from [facility] to transport patient.  ?

## 2022-03-09 NOTE — ED Notes (Signed)
Pt cursing and yelling at RN upon entering room.  Pt had removed all clothing. "You know what you did!  I'm not supposed to be here!"    Pt willing to put on blue scrubs, then attempted to walk out of ER.  Security officers and nursing staff able to stop patient.  Pt verbally redirected back to her room to eat breakfast.   ?
